# Patient Record
Sex: Female | Born: 1935 | Race: White | Hispanic: No | Marital: Married | Smoking: Never smoker
Health system: Southern US, Community
[De-identification: ages and names within clinical notes are randomized; demographics above are authoritative.]

## PROBLEM LIST (undated history)

## (undated) DIAGNOSIS — I10 Essential (primary) hypertension: Secondary | ICD-10-CM

## (undated) DIAGNOSIS — C679 Malignant neoplasm of bladder, unspecified: Secondary | ICD-10-CM

## (undated) DIAGNOSIS — E785 Hyperlipidemia, unspecified: Secondary | ICD-10-CM

## (undated) HISTORY — PX: EYE SURGERY: SHX253

## (undated) HISTORY — DX: Essential (primary) hypertension: I10

## (undated) HISTORY — PX: BREAST SURGERY: SHX581

## (undated) HISTORY — PX: OTHER SURGICAL HISTORY: SHX169

## (undated) HISTORY — DX: Malignant neoplasm of bladder, unspecified: C67.9

## (undated) HISTORY — PX: CYSTECTOMY: SUR359

## (undated) HISTORY — DX: Hyperlipidemia, unspecified: E78.5

## (undated) HISTORY — PX: ABDOMINAL HYSTERECTOMY: SHX81

---

## 1999-10-09 ENCOUNTER — Other Ambulatory Visit: Admission: RE | Admit: 1999-10-09 | Discharge: 1999-10-09 | Payer: Self-pay | Admitting: Obstetrics and Gynecology

## 2000-09-16 ENCOUNTER — Other Ambulatory Visit: Admission: RE | Admit: 2000-09-16 | Discharge: 2000-09-16 | Payer: Self-pay | Admitting: Obstetrics and Gynecology

## 2001-02-02 ENCOUNTER — Encounter: Payer: Self-pay | Admitting: Obstetrics and Gynecology

## 2001-02-09 ENCOUNTER — Encounter (INDEPENDENT_AMBULATORY_CARE_PROVIDER_SITE_OTHER): Payer: Self-pay | Admitting: Specialist

## 2001-02-09 ENCOUNTER — Inpatient Hospital Stay (HOSPITAL_COMMUNITY): Admission: RE | Admit: 2001-02-09 | Discharge: 2001-02-11 | Payer: Self-pay | Admitting: Obstetrics and Gynecology

## 2001-02-15 ENCOUNTER — Inpatient Hospital Stay (HOSPITAL_COMMUNITY): Admission: AD | Admit: 2001-02-15 | Discharge: 2001-02-15 | Payer: Self-pay | Admitting: Obstetrics and Gynecology

## 2001-08-07 ENCOUNTER — Encounter: Payer: Self-pay | Admitting: Urology

## 2001-08-07 ENCOUNTER — Observation Stay (HOSPITAL_COMMUNITY): Admission: RE | Admit: 2001-08-07 | Discharge: 2001-08-08 | Payer: Self-pay | Admitting: Urology

## 2001-08-07 ENCOUNTER — Encounter (INDEPENDENT_AMBULATORY_CARE_PROVIDER_SITE_OTHER): Payer: Self-pay | Admitting: *Deleted

## 2001-08-08 ENCOUNTER — Encounter: Payer: Self-pay | Admitting: Urology

## 2001-08-31 ENCOUNTER — Encounter (INDEPENDENT_AMBULATORY_CARE_PROVIDER_SITE_OTHER): Payer: Self-pay | Admitting: Specialist

## 2001-08-31 ENCOUNTER — Inpatient Hospital Stay (HOSPITAL_COMMUNITY): Admission: RE | Admit: 2001-08-31 | Discharge: 2001-09-08 | Payer: Self-pay | Admitting: Urology

## 2001-09-29 ENCOUNTER — Encounter: Admission: RE | Admit: 2001-09-29 | Discharge: 2001-09-29 | Payer: Self-pay | Admitting: Urology

## 2001-09-29 ENCOUNTER — Encounter: Payer: Self-pay | Admitting: Urology

## 2001-12-29 ENCOUNTER — Encounter: Admission: RE | Admit: 2001-12-29 | Discharge: 2001-12-29 | Payer: Self-pay | Admitting: Urology

## 2001-12-29 ENCOUNTER — Encounter: Payer: Self-pay | Admitting: Urology

## 2002-03-30 ENCOUNTER — Encounter: Admission: RE | Admit: 2002-03-30 | Discharge: 2002-03-30 | Payer: Self-pay | Admitting: Urology

## 2002-03-30 ENCOUNTER — Encounter: Payer: Self-pay | Admitting: Urology

## 2002-04-20 ENCOUNTER — Other Ambulatory Visit: Admission: RE | Admit: 2002-04-20 | Discharge: 2002-04-20 | Payer: Self-pay | Admitting: Obstetrics and Gynecology

## 2002-05-04 ENCOUNTER — Encounter: Admission: RE | Admit: 2002-05-04 | Discharge: 2002-05-04 | Payer: Self-pay | Admitting: Internal Medicine

## 2002-05-04 ENCOUNTER — Encounter: Payer: Self-pay | Admitting: Internal Medicine

## 2002-09-03 ENCOUNTER — Encounter: Payer: Self-pay | Admitting: Oncology

## 2002-09-03 ENCOUNTER — Encounter: Admission: RE | Admit: 2002-09-03 | Discharge: 2002-09-03 | Payer: Self-pay | Admitting: Oncology

## 2003-02-14 ENCOUNTER — Encounter: Payer: Self-pay | Admitting: Oncology

## 2003-02-14 ENCOUNTER — Encounter: Admission: RE | Admit: 2003-02-14 | Discharge: 2003-02-14 | Payer: Self-pay | Admitting: Oncology

## 2003-04-25 ENCOUNTER — Other Ambulatory Visit: Admission: RE | Admit: 2003-04-25 | Discharge: 2003-04-25 | Payer: Self-pay | Admitting: Obstetrics and Gynecology

## 2003-06-21 ENCOUNTER — Ambulatory Visit (HOSPITAL_COMMUNITY): Admission: RE | Admit: 2003-06-21 | Discharge: 2003-06-21 | Payer: Self-pay | Admitting: Internal Medicine

## 2003-06-21 ENCOUNTER — Encounter: Payer: Self-pay | Admitting: Internal Medicine

## 2003-12-23 ENCOUNTER — Ambulatory Visit (HOSPITAL_COMMUNITY): Admission: RE | Admit: 2003-12-23 | Discharge: 2003-12-23 | Payer: Self-pay | Admitting: Oncology

## 2004-08-17 ENCOUNTER — Other Ambulatory Visit: Admission: RE | Admit: 2004-08-17 | Discharge: 2004-08-17 | Payer: Self-pay | Admitting: Obstetrics and Gynecology

## 2004-11-21 ENCOUNTER — Ambulatory Visit: Payer: Self-pay | Admitting: Internal Medicine

## 2004-12-21 ENCOUNTER — Ambulatory Visit: Payer: Self-pay | Admitting: Internal Medicine

## 2004-12-28 ENCOUNTER — Ambulatory Visit: Payer: Self-pay | Admitting: Internal Medicine

## 2005-02-06 ENCOUNTER — Ambulatory Visit: Payer: Self-pay | Admitting: Oncology

## 2005-02-08 ENCOUNTER — Ambulatory Visit (HOSPITAL_COMMUNITY): Admission: RE | Admit: 2005-02-08 | Discharge: 2005-02-08 | Payer: Self-pay | Admitting: Oncology

## 2005-03-28 ENCOUNTER — Ambulatory Visit: Payer: Self-pay | Admitting: Internal Medicine

## 2005-04-05 ENCOUNTER — Ambulatory Visit: Payer: Self-pay | Admitting: Internal Medicine

## 2005-07-05 ENCOUNTER — Ambulatory Visit: Payer: Self-pay | Admitting: Internal Medicine

## 2005-07-12 ENCOUNTER — Ambulatory Visit: Payer: Self-pay | Admitting: Internal Medicine

## 2005-08-08 ENCOUNTER — Ambulatory Visit: Payer: Self-pay | Admitting: Oncology

## 2005-11-12 ENCOUNTER — Ambulatory Visit: Payer: Self-pay | Admitting: Internal Medicine

## 2005-11-19 ENCOUNTER — Ambulatory Visit: Payer: Self-pay | Admitting: Internal Medicine

## 2006-04-15 ENCOUNTER — Ambulatory Visit: Payer: Self-pay | Admitting: Internal Medicine

## 2006-04-21 ENCOUNTER — Ambulatory Visit: Payer: Self-pay | Admitting: Internal Medicine

## 2006-05-27 ENCOUNTER — Ambulatory Visit: Payer: Self-pay | Admitting: Internal Medicine

## 2006-06-24 ENCOUNTER — Ambulatory Visit: Payer: Self-pay | Admitting: Internal Medicine

## 2006-07-02 ENCOUNTER — Ambulatory Visit: Payer: Self-pay | Admitting: Internal Medicine

## 2006-08-21 ENCOUNTER — Ambulatory Visit: Payer: Self-pay | Admitting: Internal Medicine

## 2006-08-28 ENCOUNTER — Ambulatory Visit: Payer: Self-pay | Admitting: Internal Medicine

## 2006-12-25 ENCOUNTER — Ambulatory Visit: Payer: Self-pay | Admitting: Internal Medicine

## 2006-12-25 LAB — CONVERTED CEMR LAB
CO2: 26 meq/L (ref 19–32)
Calcium: 10.3 mg/dL (ref 8.4–10.5)
Chloride: 101 meq/L (ref 96–112)
Creatinine, Ser: 1.4 mg/dL — ABNORMAL HIGH (ref 0.4–1.2)
Glomerular Filtration Rate, Af Am: 48 mL/min/{1.73_m2}
Glucose, Bld: 191 mg/dL — ABNORMAL HIGH (ref 70–99)

## 2007-01-01 ENCOUNTER — Ambulatory Visit: Payer: Self-pay | Admitting: Internal Medicine

## 2007-02-02 ENCOUNTER — Ambulatory Visit: Payer: Self-pay | Admitting: Internal Medicine

## 2007-03-17 ENCOUNTER — Ambulatory Visit: Payer: Self-pay | Admitting: Internal Medicine

## 2007-03-24 ENCOUNTER — Ambulatory Visit: Payer: Self-pay | Admitting: Internal Medicine

## 2007-06-11 ENCOUNTER — Encounter: Payer: Self-pay | Admitting: Internal Medicine

## 2007-06-11 DIAGNOSIS — C679 Malignant neoplasm of bladder, unspecified: Secondary | ICD-10-CM | POA: Insufficient documentation

## 2007-06-11 DIAGNOSIS — E119 Type 2 diabetes mellitus without complications: Secondary | ICD-10-CM

## 2007-06-11 DIAGNOSIS — I10 Essential (primary) hypertension: Secondary | ICD-10-CM

## 2007-06-11 DIAGNOSIS — E785 Hyperlipidemia, unspecified: Secondary | ICD-10-CM

## 2007-07-21 ENCOUNTER — Ambulatory Visit: Payer: Self-pay | Admitting: Internal Medicine

## 2007-07-21 DIAGNOSIS — M810 Age-related osteoporosis without current pathological fracture: Secondary | ICD-10-CM | POA: Insufficient documentation

## 2007-07-26 LAB — CONVERTED CEMR LAB: Vit D, 1,25-Dihydroxy: 28 (ref 20–57)

## 2007-07-28 LAB — CONVERTED CEMR LAB
ALT: 17 units/L (ref 0–35)
BUN: 33 mg/dL — ABNORMAL HIGH (ref 6–23)
Basophils Absolute: 0 10*3/uL (ref 0.0–0.1)
Chloride: 111 meq/L (ref 96–112)
Cholesterol: 196 mg/dL (ref 0–200)
Creatinine, Ser: 1.1 mg/dL (ref 0.4–1.2)
Eosinophils Absolute: 0.1 10*3/uL (ref 0.0–0.6)
GFR calc non Af Amer: 52 mL/min
HDL: 106.3 mg/dL (ref >39.0)
Hgb A1c MFr Bld: 7 % — ABNORMAL HIGH (ref 4.6–6.0)
LDL Cholesterol: 74 mg/dL (ref 0–99)
MCHC: 34.6 g/dL (ref 30.0–36.0)
Monocytes Relative: 10 % (ref 3.0–11.0)
Platelets: 177 10*3/uL (ref 150–400)
Potassium: 4.3 meq/L (ref 3.5–5.1)
RBC: 3.56 M/uL — ABNORMAL LOW (ref 3.87–5.11)
RDW: 13.5 % (ref 11.5–14.6)
TSH: 1.45 microintl units/mL (ref 0.35–5.50)
Total CHOL/HDL Ratio: 1.8
Triglycerides: 79 mg/dL (ref 0–149)

## 2007-08-14 ENCOUNTER — Telehealth: Payer: Self-pay | Admitting: Internal Medicine

## 2007-08-24 ENCOUNTER — Ambulatory Visit: Payer: Self-pay | Admitting: Internal Medicine

## 2007-08-24 DIAGNOSIS — D649 Anemia, unspecified: Secondary | ICD-10-CM | POA: Insufficient documentation

## 2007-12-01 ENCOUNTER — Ambulatory Visit: Payer: Self-pay | Admitting: Internal Medicine

## 2007-12-02 ENCOUNTER — Encounter: Payer: Self-pay | Admitting: Internal Medicine

## 2007-12-02 LAB — CONVERTED CEMR LAB: Vit D, 1,25-Dihydroxy: 43 (ref 30–89)

## 2007-12-03 LAB — CONVERTED CEMR LAB
Basophils Absolute: 0 10*3/uL (ref 0.0–0.1)
Basophils Relative: 0.1 % (ref 0.0–1.0)
Folate: >20 ng/mL
Lymphocytes Relative: 33.1 % (ref 12.0–46.0)
MCHC: 34.5 g/dL (ref 30.0–36.0)
Monocytes Absolute: 0.5 10*3/uL (ref 0.2–0.7)
Monocytes Relative: 11.3 % — ABNORMAL HIGH (ref 3.0–11.0)
Neutro Abs: 2.2 10*3/uL (ref 1.4–7.7)
Platelets: 196 10*3/uL (ref 150–400)
Vitamin B-12: 263 pg/mL (ref 211–911)

## 2007-12-07 ENCOUNTER — Ambulatory Visit: Payer: Self-pay | Admitting: Internal Medicine

## 2007-12-07 DIAGNOSIS — N1330 Unspecified hydronephrosis: Secondary | ICD-10-CM

## 2007-12-07 DIAGNOSIS — Z8551 Personal history of malignant neoplasm of bladder: Secondary | ICD-10-CM | POA: Insufficient documentation

## 2008-01-11 ENCOUNTER — Telehealth: Payer: Self-pay | Admitting: Internal Medicine

## 2008-02-29 ENCOUNTER — Ambulatory Visit: Payer: Self-pay | Admitting: Internal Medicine

## 2008-03-02 LAB — CONVERTED CEMR LAB
Chloride: 109 meq/L (ref 96–112)
Creatinine, Ser: 1.2 mg/dL (ref 0.4–1.2)
Eosinophils Relative: 1.4 % (ref 0.0–5.0)
Glucose, Bld: 186 mg/dL — ABNORMAL HIGH (ref 70–99)
Hemoglobin: 12.1 g/dL (ref 12.0–15.0)
Lymphocytes Relative: 34.2 % (ref 12.0–46.0)
MCHC: 33.1 g/dL (ref 30.0–36.0)
MCV: 91.1 fL (ref 78.0–100.0)
Neutrophils Relative %: 51.9 % (ref 43.0–77.0)
RBC: 4.03 M/uL (ref 3.87–5.11)
RDW: 13.2 % (ref 11.5–14.6)
Sodium: 141 meq/L (ref 135–145)

## 2008-03-07 ENCOUNTER — Ambulatory Visit: Payer: Self-pay | Admitting: Internal Medicine

## 2008-03-07 DIAGNOSIS — N259 Disorder resulting from impaired renal tubular function, unspecified: Secondary | ICD-10-CM

## 2008-03-21 ENCOUNTER — Telehealth: Payer: Self-pay | Admitting: *Deleted

## 2008-04-12 ENCOUNTER — Telehealth: Payer: Self-pay | Admitting: *Deleted

## 2008-04-13 ENCOUNTER — Telehealth: Payer: Self-pay | Admitting: Internal Medicine

## 2008-04-14 ENCOUNTER — Ambulatory Visit: Payer: Self-pay | Admitting: Internal Medicine

## 2008-04-14 LAB — CONVERTED CEMR LAB: Blood Glucose, Fingerstick: 375

## 2008-04-22 ENCOUNTER — Telehealth: Payer: Self-pay | Admitting: Internal Medicine

## 2008-05-27 ENCOUNTER — Ambulatory Visit: Payer: Self-pay | Admitting: Internal Medicine

## 2008-05-30 ENCOUNTER — Ambulatory Visit: Payer: Self-pay | Admitting: Internal Medicine

## 2008-05-30 LAB — CONVERTED CEMR LAB
Blood Glucose, Fingerstick: 160
Hgb A1c MFr Bld: 7.7 % — ABNORMAL HIGH (ref 4.6–6.0)

## 2008-06-01 ENCOUNTER — Ambulatory Visit: Payer: Self-pay | Admitting: Internal Medicine

## 2008-06-20 ENCOUNTER — Telehealth: Payer: Self-pay | Admitting: *Deleted

## 2008-07-01 ENCOUNTER — Encounter: Payer: Self-pay | Admitting: Internal Medicine

## 2008-08-19 ENCOUNTER — Ambulatory Visit: Payer: Self-pay | Admitting: Internal Medicine

## 2008-08-19 LAB — CONVERTED CEMR LAB
ALT: 20 units/L (ref 0–35)
AST: 26 units/L (ref 0–37)
Alkaline Phosphatase: 30 units/L — ABNORMAL LOW (ref 39–117)
BUN: 37 mg/dL — ABNORMAL HIGH (ref 6–23)
CO2: 25 meq/L (ref 19–32)
Chloride: 110 meq/L (ref 96–112)
Cholesterol: 181 mg/dL (ref 0–200)
Creatinine, Ser: 1.2 mg/dL (ref 0.4–1.2)
Hgb A1c MFr Bld: 7.2 % — ABNORMAL HIGH (ref 4.6–6.0)
Total Bilirubin: 0.9 mg/dL (ref 0.3–1.2)
Total CHOL/HDL Ratio: 1.9
Triglycerides: 61 mg/dL (ref 0–149)

## 2008-08-26 ENCOUNTER — Ambulatory Visit: Payer: Self-pay | Admitting: Internal Medicine

## 2008-09-06 ENCOUNTER — Telehealth: Payer: Self-pay | Admitting: *Deleted

## 2008-11-01 ENCOUNTER — Telehealth: Payer: Self-pay | Admitting: Internal Medicine

## 2008-11-29 ENCOUNTER — Ambulatory Visit: Payer: Self-pay | Admitting: Internal Medicine

## 2008-12-06 ENCOUNTER — Ambulatory Visit: Payer: Self-pay | Admitting: Internal Medicine

## 2008-12-06 DIAGNOSIS — M25569 Pain in unspecified knee: Secondary | ICD-10-CM | POA: Insufficient documentation

## 2009-01-03 ENCOUNTER — Telehealth: Payer: Self-pay | Admitting: *Deleted

## 2009-01-09 ENCOUNTER — Ambulatory Visit: Payer: Self-pay | Admitting: Internal Medicine

## 2009-01-09 DIAGNOSIS — H04209 Unspecified epiphora, unspecified lacrimal gland: Secondary | ICD-10-CM | POA: Insufficient documentation

## 2009-03-02 ENCOUNTER — Ambulatory Visit: Payer: Self-pay | Admitting: Internal Medicine

## 2009-03-02 LAB — CONVERTED CEMR LAB
BUN: 38 mg/dL — ABNORMAL HIGH (ref 6–23)
CO2: 28 meq/L (ref 19–32)
Chloride: 108 meq/L (ref 96–112)
Creatinine,U: 89.3 mg/dL
GFR calc non Af Amer: 46.65 mL/min (ref >60)
Glucose, Bld: 167 mg/dL — ABNORMAL HIGH (ref 70–99)
Microalb Creat Ratio: 88.5 mg/g — ABNORMAL HIGH (ref 0.0–30.0)
Potassium: 4.8 meq/L (ref 3.5–5.1)

## 2009-03-09 ENCOUNTER — Ambulatory Visit: Payer: Self-pay | Admitting: Internal Medicine

## 2009-03-09 LAB — CONVERTED CEMR LAB: Blood Glucose, Fingerstick: 188

## 2009-04-07 ENCOUNTER — Telehealth: Payer: Self-pay | Admitting: Internal Medicine

## 2009-04-18 ENCOUNTER — Telehealth: Payer: Self-pay | Admitting: *Deleted

## 2009-04-27 ENCOUNTER — Telehealth: Payer: Self-pay | Admitting: *Deleted

## 2009-05-16 ENCOUNTER — Ambulatory Visit: Payer: Self-pay | Admitting: Internal Medicine

## 2009-05-18 LAB — CONVERTED CEMR LAB: Hgb A1c MFr Bld: 7 % — ABNORMAL HIGH (ref 4.6–6.5)

## 2009-06-16 ENCOUNTER — Ambulatory Visit: Payer: Self-pay | Admitting: Internal Medicine

## 2009-06-16 LAB — CONVERTED CEMR LAB: Blood Glucose, Fingerstick: 136

## 2009-07-03 ENCOUNTER — Encounter: Payer: Self-pay | Admitting: Internal Medicine

## 2009-07-10 ENCOUNTER — Encounter: Payer: Self-pay | Admitting: Internal Medicine

## 2009-07-10 ENCOUNTER — Telehealth: Payer: Self-pay | Admitting: Internal Medicine

## 2009-07-18 ENCOUNTER — Encounter: Payer: Self-pay | Admitting: Internal Medicine

## 2009-09-14 ENCOUNTER — Telehealth: Payer: Self-pay | Admitting: *Deleted

## 2009-09-19 ENCOUNTER — Ambulatory Visit: Payer: Self-pay | Admitting: Internal Medicine

## 2009-09-19 LAB — CONVERTED CEMR LAB
Albumin: 4 g/dL (ref 3.5–5.2)
BUN: 37 mg/dL — ABNORMAL HIGH (ref 6–23)
Cholesterol: 207 mg/dL — ABNORMAL HIGH (ref 0–200)
Creatinine, Ser: 1.3 mg/dL — ABNORMAL HIGH (ref 0.4–1.2)
GFR calc non Af Amer: 42.47 mL/min (ref >60)
Glucose, Bld: 164 mg/dL — ABNORMAL HIGH (ref 70–99)
HDL: 93.4 mg/dL (ref >39.00)
Hgb A1c MFr Bld: 6.8 % — ABNORMAL HIGH (ref 4.6–6.5)
Total Protein: 6.9 g/dL (ref 6.0–8.3)
Triglycerides: 95 mg/dL (ref 0.0–149.0)
VLDL: 19 mg/dL (ref 0.0–40.0)

## 2009-09-26 ENCOUNTER — Ambulatory Visit: Payer: Self-pay | Admitting: Internal Medicine

## 2009-10-12 ENCOUNTER — Encounter: Payer: Self-pay | Admitting: Internal Medicine

## 2010-01-02 ENCOUNTER — Encounter: Payer: Self-pay | Admitting: Internal Medicine

## 2010-01-19 ENCOUNTER — Ambulatory Visit: Payer: Self-pay | Admitting: Internal Medicine

## 2010-01-19 LAB — CONVERTED CEMR LAB
BUN: 43 mg/dL — ABNORMAL HIGH (ref 6–23)
Creatinine, Ser: 1.2 mg/dL (ref 0.4–1.2)
GFR calc non Af Amer: 46.54 mL/min (ref >60)
Glucose, Bld: 148 mg/dL — ABNORMAL HIGH (ref 70–99)
Hgb A1c MFr Bld: 7 % — ABNORMAL HIGH (ref 4.6–6.5)

## 2010-01-23 ENCOUNTER — Encounter: Payer: Self-pay | Admitting: Internal Medicine

## 2010-01-26 ENCOUNTER — Ambulatory Visit: Payer: Self-pay | Admitting: Internal Medicine

## 2010-01-26 DIAGNOSIS — M949 Disorder of cartilage, unspecified: Secondary | ICD-10-CM

## 2010-01-26 DIAGNOSIS — M899 Disorder of bone, unspecified: Secondary | ICD-10-CM | POA: Insufficient documentation

## 2010-02-27 ENCOUNTER — Telehealth: Payer: Self-pay | Admitting: *Deleted

## 2010-04-20 ENCOUNTER — Telehealth: Payer: Self-pay | Admitting: *Deleted

## 2010-05-01 ENCOUNTER — Ambulatory Visit: Payer: Self-pay | Admitting: Internal Medicine

## 2010-05-01 LAB — CONVERTED CEMR LAB
Microalb Creat Ratio: 7.8 mg/g (ref 0.0–30.0)
Microalb, Ur: 8.3 mg/dL — ABNORMAL HIGH (ref 0.0–1.9)

## 2010-05-08 ENCOUNTER — Ambulatory Visit: Payer: Self-pay | Admitting: Internal Medicine

## 2010-05-10 LAB — CONVERTED CEMR LAB
Basophils Absolute: 0 10*3/uL (ref 0.0–0.1)
Basophils Relative: 0.4 % (ref 0.0–3.0)
CO2: 28 meq/L (ref 19–32)
Calcium: 10.4 mg/dL (ref 8.4–10.5)
Eosinophils Relative: 0.9 % (ref 0.0–5.0)
GFR calc non Af Amer: 33.37 mL/min (ref >60)
Glucose, Bld: 166 mg/dL — ABNORMAL HIGH (ref 70–99)
HCT: 35.6 % — ABNORMAL LOW (ref 36.0–46.0)
Hemoglobin: 12.2 g/dL (ref 12.0–15.0)
Lymphocytes Relative: 24.2 % (ref 12.0–46.0)
Lymphs Abs: 1.5 10*3/uL (ref 0.7–4.0)
Monocytes Relative: 9.8 % (ref 3.0–12.0)
Neutro Abs: 3.9 10*3/uL (ref 1.4–7.7)
Potassium: 5 meq/L (ref 3.5–5.1)
RBC: 3.83 M/uL — ABNORMAL LOW (ref 3.87–5.11)
RDW: 14.3 % (ref 11.5–14.6)
Sodium: 140 meq/L (ref 135–145)

## 2010-05-15 ENCOUNTER — Encounter: Admission: RE | Admit: 2010-05-15 | Discharge: 2010-05-15 | Payer: Self-pay | Admitting: Internal Medicine

## 2010-06-08 ENCOUNTER — Encounter: Payer: Self-pay | Admitting: Internal Medicine

## 2010-07-11 ENCOUNTER — Encounter: Payer: Self-pay | Admitting: Internal Medicine

## 2010-08-14 ENCOUNTER — Encounter: Payer: Self-pay | Admitting: Internal Medicine

## 2010-08-16 LAB — HM DIABETES EYE EXAM: HM Diabetic Eye Exam: NORMAL

## 2010-08-30 ENCOUNTER — Ambulatory Visit: Payer: Self-pay | Admitting: Internal Medicine

## 2010-08-30 LAB — CONVERTED CEMR LAB
AST: 24 units/L (ref 0–37)
Albumin: 3.8 g/dL (ref 3.5–5.2)
HDL: 98 mg/dL (ref >39.00)
Hgb A1c MFr Bld: 7.3 % — ABNORMAL HIGH (ref 4.6–6.5)
LDL Cholesterol: 74 mg/dL (ref 0–99)
Total Bilirubin: 0.4 mg/dL (ref 0.3–1.2)
Total CHOL/HDL Ratio: 2
Triglycerides: 67 mg/dL (ref 0.0–149.0)
VLDL: 13.4 mg/dL (ref 0.0–40.0)

## 2010-09-24 ENCOUNTER — Ambulatory Visit: Payer: Self-pay | Admitting: Internal Medicine

## 2010-09-24 ENCOUNTER — Encounter: Payer: Self-pay | Admitting: *Deleted

## 2010-09-24 LAB — CONVERTED CEMR LAB
BUN: 46 mg/dL
Creatinine, Ser: 1.32 mg/dL
Potassium: 4.7 meq/L

## 2010-09-26 ENCOUNTER — Encounter: Payer: Self-pay | Admitting: Internal Medicine

## 2010-09-26 ENCOUNTER — Telehealth: Payer: Self-pay | Admitting: Internal Medicine

## 2010-10-15 ENCOUNTER — Encounter: Payer: Self-pay | Admitting: *Deleted

## 2010-12-28 ENCOUNTER — Other Ambulatory Visit: Payer: Self-pay | Admitting: Internal Medicine

## 2010-12-28 ENCOUNTER — Ambulatory Visit
Admission: RE | Admit: 2010-12-28 | Discharge: 2010-12-28 | Payer: Self-pay | Source: Home / Self Care | Attending: Internal Medicine | Admitting: Internal Medicine

## 2010-12-28 LAB — HEMOGLOBIN A1C: Hgb A1c MFr Bld: 7 % — ABNORMAL HIGH (ref 4.6–6.5)

## 2011-01-04 ENCOUNTER — Ambulatory Visit
Admission: RE | Admit: 2011-01-04 | Discharge: 2011-01-04 | Payer: Self-pay | Source: Home / Self Care | Attending: Internal Medicine | Admitting: Internal Medicine

## 2011-01-04 DIAGNOSIS — M79609 Pain in unspecified limb: Secondary | ICD-10-CM | POA: Insufficient documentation

## 2011-01-04 LAB — HM DIABETES FOOT EXAM

## 2011-01-06 ENCOUNTER — Encounter: Payer: Self-pay | Admitting: Sports Medicine

## 2011-01-15 NOTE — Progress Notes (Signed)
Summary: refill on meds  Phone Note From Pharmacy   Caller: [facility] Reason for Call: Needs renewal Details for Reason: Diovan 160mg  and Actos Initial call taken by: Romualdo Bolk, CMA (AAMA),  February 27, 2010 11:12 AM  Follow-up for Phone Call        refill on meds. Follow-up by: Romualdo Bolk, CMA (AAMA),  February 27, 2010 11:13 AM    Prescriptions: DIOVAN 160 MG TABS (VALSARTAN) Take 1 tablet by mouth once a day  #90 x 3   Entered by:   Romualdo Bolk, CMA (AAMA)   Authorized by:   Madelin Headings MD   Signed by:   Romualdo Bolk, CMA (AAMA) on 02/27/2010   Method used:   Electronically to        [facility]* (mail-order)             ,          Ph: [phone]       Fax: [phone]   RxID:   [id number] ACTOS 30 MG TABS (PIOGLITAZONE HCL) Take 1 tablet by mouth once a day  #90 x 3   Entered by:   Romualdo Bolk, CMA (AAMA)   Authorized by:   Madelin Headings MD   Signed by:   Romualdo Bolk, CMA (AAMA) on 02/27/2010   Method used:   Electronically to        [facility]* (mail-order)             ,          Ph: [phone]       Fax: [phone]   RxID:   [id number]

## 2011-01-15 NOTE — Assessment & Plan Note (Signed)
Summary: 4 MONTH FUP//CCM   Vital Signs:  Patient profile:   77 year old female Menstrual status:  postmenopausal Height:      60.5 inches Weight:      150 pounds BMI:     28.92 Pulse rate:   88 / minute BP sitting:   120 / 79  (right arm) Cuff size:   regular  Vitals Entered By: Romualdo Bolk, CMA (AAMA) (January 26, 2010 10:48 AM) CC: Follow-up visit on labs, Hypertension Management   History of Present Illness: Cassandra Evans comesin today for   for  follow up of multiple medical problems   since last visit had Dexa scan   No  major change in health  Saw  GYNE To see eye doc and changed drops.  BG has been good .  somehow after couple glasses of wine. No numbness weakness skin changes or infections.  BP has been good.  no se of meds  LIPID: no change and no se of meds . No cv or pulm signs   Hypertension History:      She denies headache, chest pain, palpitations, dyspnea with exertion, orthopnea, PND, peripheral edema, visual symptoms, neurologic problems, syncope, and side effects from treatment.  She notes no problems with any antihypertensive medication side effects.        Positive major cardiovascular risk factors include female age 98 years old or older, diabetes, hyperlipidemia, and hypertension.  Negative major cardiovascular risk factors include non-tobacco-user status.       Preventive Screening-Counseling & Management  Alcohol-Tobacco     Alcohol drinks/day: <1     Alcohol type: wine     Smoking Status: never  Caffeine-Diet-Exercise     Caffeine use/day: 3     Does Patient Exercise: yes     Type of exercise: aerobics and walking     Exercise (avg: min/session): 30-60     Times/week: 7  Current Medications (verified): 1)  Actos 30 Mg Tabs (Pioglitazone Hcl) .... Take 1 Tablet By Mouth Once A Day 2)  Bd U/f Iii Short Pen Needle 31g X 8 Mm Misc (Insulin Pen Needle) .... Use As Directed 3)  Calcium 500 Mg Tabs (Calcium) .... Take 4)  Diovan  160 Mg Tabs (Valsartan) .... Take 1 Tablet By Mouth Once A Day 5)  Fosamax 70 Mg Tabs (Alendronate Sodium) .... Take 1 Tablet By Mouth Once A Week 6)  Freestyle Lancets  Misc (Lancets) .... Use 1 Needle Four Times A Day 7)  Glipizide 5 Mg Tb24 (Glipizide) .Marland Kitchen.. 1 By Mouth Once Daily 8)  Lantus Solostar 100 Unit/ml  Soln (Insulin Glargine) .... 6-10 U Per Day As Directed 9)  Pataday 0.2 % Soln (Olopatadine Hcl) 10)  Simvastatin 40 Mg Tabs (Simvastatin) .... Take 1 Tablet By Mouth At Bedtime 11)  Multiple Vitamins   Tabs (Multiple Vitamin) 12)  Hollister 8484-Urostomy Pouch .... Use 1 A Week 13)  Bard Pd [id number] Overnight Drainage .... Use As Directed 14)  Freestyle Test   Strp (Glucose Blood) .... Use 1 Strip Four Times A Day As Directed 15)  Baby Aspirin 81 Mg Chew (Aspirin) .... Take 1/2 Tab Daily 16)  Januvia 100 Mg Tabs (Sitagliptin Phosphate) .... 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  Past Medical History: Reviewed history from 01/09/2009 and no changes required. Diabetes mellitus, type II Hyperlipidemia Hypertension Osteoporosis Bladder cancer more than 5 years out       [facility] Dr. Hazle Quant  eye  Past Surgical History: Reviewed history from 05/27/2008 and no changes required. Hysterectomy Cystectomy Incontinent Urinary Diversion    Family History: Reviewed history from 12/06/2008 and no changes required. mom died of leukemia   Social History: Reviewed history from 03/09/2009 and no changes  required. 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   Eyes:  glasses Neck:  No deformities, masses, or tenderness noted. Lungs:  Normal respiratory effort, chest expands symmetrically. Lungs are clear to auscultation, no crackles or wheezes.no dullness.   Heart:  Normal rate and regular rhythm. S1 and S2 normal without gallop, murmur, click, rub or other extra sounds. Abdomen:  soft, non-tender, no hepatomegaly, and no splenomegaly.   Extremities:  no clubbing cyanosis or edema  Neurologic:  alert & oriented X3, strength normal in all extremities, and gait normal.   Skin:  turgor normal, color normal, no ecchymoses, no petechiae, and no purpura.   Cervical Nodes:  No lymphadenopathy noted Psych:  Oriented X3, good eye contact, not anxious appearing, and not depressed appearing.    Diabetes Management Exam:    Foot Exam (with socks and/or shoes not present):       Inspection:          Left foot: normal          Right foot: normal   Impression & Recommendations:  Problem # 1:  DIABETES MELLITUS, TYPE II (ICD-250.00) counseled  Her updated medication list for this problem includes:    Actos 30 Mg Tabs (Pioglitazone hcl) .Marland Kitchen... Take 1 tablet by mouth once a day    Diovan 160 Mg Tabs (Valsartan) .Marland Kitchen... Take 1 tablet by mouth once a day    Glipizide 5 Mg Tb24 (Glipizide) .Marland Kitchen... 1 by mouth once daily    Lantus Solostar 100 Unit/ml Soln (Insulin glargine) .Marland Kitchen... 6-10 u per day as directed    Baby Aspirin 81 Mg Chew (Aspirin) .Marland Kitchen... Take 1/2 tab daily    Januvia 100 Mg Tabs (Sitagliptin phosphate) .Marland Kitchen... Take 1/2 by mouth q d  Labs Reviewed: Creat: 1.2 (01/19/2010)     Last Eye Exam: normal (03/16/2009) Reviewed HgBA1c results: 7.0 (01/19/2010)  6.8 (09/19/2009)  Problem #  2:  HYPERTENSION (ICD-401.9) controlled  Her updated medication list for this problem includes:    Diovan 160 Mg Tabs (Valsartan) .Marland Kitchen... Take 1 tablet by mouth once a day  Problem # 3:  HYPERLIPIDEMIA (ICD-272.4)  Her updated medication list for this problem includes:    Simvastatin 40 Mg Tabs (Simvastatin) .Marland Kitchen... Take 1 tablet by mouth at bedtime  Labs Reviewed: SGOT: 26 (09/19/2009)   SGPT: 21 (09/19/2009)  Prior 10 Yr Risk Heart Disease: 9 % (09/26/2009)   HDL:93.40 (09/19/2009), 95.2 (08/19/2008)  LDL:74 (08/19/2008), 74 (07/21/2007)  Chol:207 (09/19/2009), 181 (08/19/2008)  Trig:95.0 (09/19/2009), 61 (08/19/2008)  Problem # 4:  RENAL INSUFFICIENCY (ICD-588.9) Assessment: Unchanged cants explain the bump in BUn  but creatinine is quite stable.  Problem # 5:  OSTEOPENIA (ICD-733.90) see dexa  in osteopeni range    burt has been on fosamax for about 10 years and no hx of fx or fam hx of fx . agree can stop med for now.  disc no consensus on length of med rx recs. Her updated medication list for this problem includes:    Calcium 500 Mg Tabs (Calcium) .Marland Kitchen... Take    Fosamax 70 Mg Tabs (Alendronate sodium) .Marland Kitchen... Take 1 tablet by mouth once a week    Vitamin D 400 Unit Caps (Cholecalciferol)  Complete Medication List: 1)  Actos 30 Mg Tabs (Pioglitazone hcl) .... Take 1 tablet by mouth once a day 2)  Bd U/f Iii Short Pen Needle 31g X 8 Mm Misc (Insulin pen needle) .... Use as directed 3)  Calcium 500 Mg Tabs (Calcium) .... Take 4)  Diovan 160 Mg Tabs (Valsartan) .... Take 1 tablet by mouth once a day 5)  Fosamax 70 Mg Tabs (Alendronate sodium) .... Take 1 tablet by mouth once a week 6)  Freestyle Lancets Misc (Lancets) .... Use 1 needle four times a day 7)  Glipizide 5 Mg Tb24 (Glipizide) .Marland Kitchen.. 1 by mouth once daily 8)  Lantus Solostar 100 Unit/ml Soln (Insulin glargine) .... 6-10 u per day as directed 9)  Pataday 0.2 % Soln (Olopatadine hcl) 10)  Simvastatin 40 Mg Tabs (Simvastatin)  .... Take 1 tablet by mouth at bedtime 11)  Multiple Vitamins Tabs (Multiple vitamin) 12)  Hollister 8484-urostomy Pouch  .... Use 1 a week 13)  Bard Pd [id number] Overnight Drainage  .... Use as directed 14)  Freestyle Test Strp (Glucose blood) .... Use 1 strip four times a day as directed 15)  Baby Aspirin 81 Mg Chew (Aspirin) .... Take 1/2 tab daily 16)  Januvia 100 Mg Tabs (Sitagliptin phosphate) .... Take 1/2 by mouth q d 17)  Vitamin D 400 Unit Caps (Cholecalciferol)  Hypertension Assessment/Plan:      The patient's hypertensive risk group is category C: Target organ damage and/or diabetes.  Her calculated 10 year risk of coronary heart disease is 9 %.  Today's blood pressure is 120/79.  Her blood pressure goal is < 130/80.  Patient Instructions: 1)  ok to stop the fosomax   and   can repeat  dexa  scan in another 2 years.   2)  HgBA1c prior to visit  ICD-9:    3)  Urine Microalbumin prior to visit ICD-9 :  4)  Bun and creatinine    5)  Please schedule a follow-up appointment in 3 months .  6)  Continue to have fun .

## 2011-01-15 NOTE — Letter (Signed)
Summary: [facility]   Imported By: Maryln Gottron 10/16/2010 15:36:42  _____________________________________________________________________  External Attachment:    Type:   Image     Comment:   External Document

## 2011-01-15 NOTE — Progress Notes (Signed)
Summary: FYI  Phone Note Call from Patient Call back at [facility] Phone [phone]   Caller: Patient Summary of Call: FYI- Pt states that she saw Dr. Allena Katz is very pleased with the results. She is to continue what she is doing. Initial call taken by: Romualdo Bolk, CMA Duncan Dull),  September 26, 2010 4:34 PM

## 2011-01-15 NOTE — Assessment & Plan Note (Signed)
Summary: 3 month rov/njr   Vital Signs:  Patient profile:   77 year old female Menstrual status:  postmenopausal Height:      60.5 inches Weight:      149 pounds Temp:     98.2 degrees F oral Pulse rate:   97 / minute BP sitting:   130 / 60  (right arm)  Vitals Entered By: Kathrynn Speed CMA (May 08, 2010 10:43 AM) CC: 3 mth FU / labs, Hypertension Management   History of Present Illness: Cassandra Evans comesin comes in today  for follow up of her multiple medical problems . She feels well and no change in  meds . No interim sig illness. since her February visit. DM: seems controlled   Low after 2 glasses of wine.   45 at night    x 1 . no other times .  HT bp has been normal  nose of meds  LIPIDs: nose of meds No use of  nsaids on a areg basis but does take asas.  Hypertension History:      She denies headache, chest pain, palpitations, dyspnea with exertion, peripheral edema, visual symptoms, neurologic problems, syncope, and side effects from treatment.  She notes no problems with any antihypertensive medication side effects.        Positive major cardiovascular risk factors include female age 10 years old or older, diabetes, hyperlipidemia, and hypertension.  Negative major cardiovascular risk factors include non-tobacco-user status.     Preventive Screening-Counseling & Management  Alcohol-Tobacco     Alcohol drinks/day: <1     Alcohol type: wine     Smoking Status: never  Caffeine-Diet-Exercise     Caffeine use/day: 3     Does Patient Exercise: yes     Type of exercise: aerobics and walking     Exercise (avg: min/session): 30-60     Times/week: 7  Current Medications (verified): 1)  Actos 30 Mg Tabs (Pioglitazone Hcl) .... Take 1 Tablet By Mouth Once A Day 2)  Bd U/f Iii Short Pen Needle 31g X 8 Mm Misc (Insulin Pen Needle) .... Use As Directed 3)  Calcium 500 Mg Tabs (Calcium) .... Take 4)  Diovan 160 Mg Tabs (Valsartan) .... Take 1 Tablet By Mouth Once A  Day 5)  Fosamax 70 Mg Tabs (Alendronate Sodium) .... Take 1 Tablet By Mouth Once A Week 6)  Freestyle Lancets  Misc (Lancets) .... Use 1 Needle Four Times A Day 7)  Glipizide 5 Mg Tb24 (Glipizide) .Marland Kitchen.. 1 By Mouth Once Daily 8)  Lantus Solostar 100 Unit/ml  Soln (Insulin Glargine) .... 6-10 U Per Day As Directed 9)  Simvastatin 40 Mg Tabs (Simvastatin) .... Take 1 Tablet By Mouth At Bedtime 10)  Multiple Vitamins   Tabs (Multiple Vitamin) 11)  Hollister 8484-Urostomy Pouch .... Use 1 A Week 12)  Bard Pd [id number] Overnight Drainage .... Use As Directed 13)  Freestyle Test   Strp (Glucose Blood) .... Use 1 Strip Four Times A Day As Directed 14)  Baby Aspirin 81 Mg Chew (Aspirin) .... Take 1/2 Tab Daily 15)  Januvia 100 Mg Tabs (Sitagliptin Phosphate) .... Take 1/2 By Mouth Q D 16)  Vitamin D 400 Unit Caps (Cholecalciferol) 17)  Patanol 0.1 % Soln (Olopatadine Hcl) .Marland Kitchen.. 1 Drop Each Eye 2 X A Day  Allergies (verified): No Known Drug Allergies  Past History:  Care Management: Gynecology: Dr. 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   Review of Systems       nef cv pulmonary and no GI GU signs no bleeding or swollen glands   fever or weight change   Physical Exam  General:  Well-developed,well-nourished,in no acute distress; alert,appropriate and cooperative throughout examination Head:  normocephalic and atraumatic.   Eyes:  glasses  Neck:  No deformities, masses, or tenderness noted. Lungs:  Normal respiratory effort, chest expands symmetrically. Lungs are clear to auscultation, no crackles or wheezes.no dullness.   Heart:  Normal rate and regular rhythm. S1 and S2 normal without gallop, murmur, click, rub or other extra sounds. Abdomen:  soft, non-tender, no hepatomegaly,  and no splenomegaly.   Pulses:  pulses intact without delay   Extremities:  no clubbing cyanosis or edema  Neurologic:  alert & oriented X3, sensation intact to light touch, and gait normal.   Skin:  turgor normal, color normal, no ecchymoses, and no petechiae.   Cervical Nodes:  No lymphadenopathy noted Psych:  Oriented X3, normally interactive, good eye contact, not anxious appearing, and not depressed appearing.    Diabetes Management Exam:    Foot Exam (with socks and/or shoes not present):       Sensory-Monofilament:          Left foot: normal          Right foot: normal       Inspection:          Left foot: normal          Right foot: normal   Impression & Recommendations:  Problem # 1:  DIABETES MELLITUS, TYPE II (ICD-250.00) Assessment Improved at goal  disc avoiding hypoglycemia  Her updated medication list for this problem includes:    Actos 30 Mg Tabs (Pioglitazone hcl) .Marland Kitchen... Take 1 tablet by mouth once a day    Diovan 160 Mg Tabs (Valsartan) .Marland Kitchen... Take 1 tablet by mouth once a day    Glipizide 5 Mg Tb24 (Glipizide) .Marland Kitchen... 1 by mouth once daily    Lantus Solostar 100 Unit/ml Soln (Insulin glargine) .Marland Kitchen... 6-10 u per day as directed    Baby Aspirin 81 Mg Chew (Aspirin) .Marland Kitchen... Take 1/2 tab daily    Januvia 100 Mg Tabs (Sitagliptin phosphate) .Marland Kitchen... Take 1/2 by mouth q d  Orders: TLB-TSH (Thyroid Stimulating Hormone) [phone])  Labs Reviewed: Creat: 1.6 (05/01/2010)     Last Eye Exam: normal (03/16/2009) Reviewed HgBA1c results: 6.8 (05/01/2010)  7.0 (01/19/2010)  Problem # 2:  RENAL INSUFFICIENCY (ICD-588.9) Assessment: Deteriorated  ? explanation    of cr 1.6       will recheck  and if persists    then   renal consult     Orders: TLB-BMP (Basic Metabolic Panel-BMET) (80048-METABOL) TLB-CBC Platelet - w/Differential (85025-CBCD) TLB-TSH (Thyroid Stimulating Hormone) (84443-TSH) Venipuncture (54098)  Problem # 3:  HYPERTENSION (ICD-401.9) Assessment:  Unchanged  Her updated medication list for this problem includes:    Diovan 160 Mg Tabs (Valsartan) .Marland Kitchen... Take 1 tablet by mouth once a day  Orders: TLB-TSH (Thyroid Stimulating Hormone) (84443-TSH)  BP today: 130/60 Prior BP: 120/79 (01/26/2010)  Prior 10 Yr Risk Heart Disease: 9 % (09/26/2009)  Labs Reviewed: K+: 4.6 (01/19/2010) Creat: : 1.6 (05/01/2010)   Chol: 207 (09/19/2009)   HDL: 93.40 (09/19/2009)  LDL: 74 (08/19/2008)   TG: 95.0 (09/19/2009)  Problem # 4:  NEOPLASM, MALIGNANT, BLADDER, HX OF (ICD-V10.51) stable on a as needed  follow up   Complete Medication List: 1)  Actos 30 Mg Tabs (Pioglitazone hcl) .... Take 1 tablet by mouth once a day 2)  Bd U/f Iii Short Pen Needle 31g X 8 Mm Misc (Insulin pen needle) .... Use as directed 3)  Calcium 500 Mg Tabs (Calcium) .... Take 4)  Diovan 160 Mg Tabs (Valsartan) .... Take 1 tablet by mouth once a day 5)  Fosamax 70 Mg Tabs (Alendronate sodium) .... Take 1 tablet by mouth once a week 6)  Freestyle Lancets Misc (Lancets) .... Use 1 needle four times a day 7)  Glipizide 5 Mg Tb24 (Glipizide) .Marland Kitchen.. 1 by mouth once daily 8)  Lantus Solostar 100 Unit/ml Soln (Insulin glargine) .... 6-10 u per day as directed 9)  Simvastatin 40 Mg Tabs (Simvastatin) .... Take 1 tablet by mouth at bedtime 10)  Multiple Vitamins Tabs (Multiple vitamin) 11)  Hollister 8484-urostomy Pouch  .... Use 1 a week 12)  Bard Pd [id number] Overnight Drainage  .... Use as directed 13)  Freestyle Test Strp (Glucose blood) .... Use 1 strip four times a day as directed 14)  Baby Aspirin 81 Mg Chew (Aspirin) .... Take 1/2 tab daily 15)  Januvia 100 Mg Tabs (Sitagliptin phosphate) .... Take 1/2 by mouth q d 16)  Vitamin D 400 Unit Caps (Cholecalciferol) 17)  Patanol 0.1 % Soln (Olopatadine hcl) .Marland Kitchen.. 1 drop each eye 2 x a day  Hypertension Assessment/Plan:      The patient's hypertensive risk group is category C: Target organ damage and/or diabetes.  Her calculated  10 year risk of coronary heart disease is 9 %.  Today's blood pressure is 130/60.  Her blood pressure goal is < 130/80.  Patient Instructions: 1)  You will be informed of lab results when available. and  follow up  2)  if  kidney function is still off then we will get a consult from nephrology . 3)  Otherwise your are doing well but avoid hypoglycemia.

## 2011-01-15 NOTE — Miscellaneous (Signed)
Summary: labs done by Dr. Allena Katz  Clinical Lists Changes  Observations: Added new observation of CREATININE: 1.32 mg/dL (78/46/9629 5:28) Added new observation of K SERUM: 4.7 meq/L (09/24/2010 8:23) Added new observation of BUN: 46 mg/dL (41/32/4401 0:27)      -  Date:  09/24/2010    K 4.7    CREAT 1.32

## 2011-01-15 NOTE — Assessment & Plan Note (Signed)
Summary: 4 month follow up/cjr pt rsc/njr   Vital Signs:  Patient profile:   77 year old female Menstrual status:  postmenopausal Weight:      146 pounds Pulse rate:   78 / minute BP sitting:   120 / 70  (left arm) Cuff size:   regular  Vitals Entered By: Romualdo Bolk, CMA (AAMA) (September 24, 2010 10:59 AM) CC: follow-up visit, Hypertension Management   History of Present Illness: Cassandra Evans  comesin today for follow up of her medical conditions  RENAL: proteinuria and renal insuff and solitay kidney.  Has seen nephrologist and apparenlty doing ok  but was told to stop ca and monitor .has stopped calcium   had elevated ca and potassium at the renal doctorand increasing fluids. Was also   supposed to be on a different diet.  DM:  No change in vision, no infections are injuries. Saw Dr Hazle Quant.She did travel to [place] and did well could've been better. She's had no low blood sugars in her diabetes is stable. LIPIDS : no se of meds   Hypertension History:      She denies headache, chest pain, palpitations, dyspnea with exertion, orthopnea, PND, peripheral edema, visual symptoms, neurologic problems, syncope, and side effects from treatment.  She notes no problems with any antihypertensive medication side effects.        Positive major cardiovascular risk factors include female age 34 years old or older, diabetes, hyperlipidemia, and hypertension.  Negative major cardiovascular risk factors include non-tobacco-user status.      Preventive Screening-Counseling & Management  Alcohol-Tobacco     Alcohol drinks/day: <1     Alcohol type: wine     Smoking Status: never  Caffeine-Diet-Exercise     Caffeine use/day: 3     Does Patient Exercise: yes     Type of exercise: aerobics and walking     Exercise (avg: min/session): 30-60     Times/week: 7  Current Medications (verified): 1)  Actos 30 Mg Tabs (Pioglitazone Hcl) .... Take 1 Tablet By Mouth Once A Day 2)  Bd U/f Iii  Short Pen Needle 31g X 8 Mm Misc (Insulin Pen Needle) .... Use As Directed 3)  Diovan 160 Mg Tabs (Valsartan) .... Take 1 Tablet By Mouth Once A Day 4)  Freestyle Lancets  Misc (Lancets) .... Use 1 Needle Four Times A Day 5)  Glipizide 5 Mg Tb24 (Glipizide) .Marland Kitchen.. 1 By Mouth Once Daily 6)  Lantus Solostar 100 Unit/ml  Soln (Insulin Glargine) .... 6-10 U Per Day As Directed 7)  Simvastatin 40 Mg Tabs (Simvastatin) .... Take 1 Tablet By Mouth At Bedtime 8)  Multiple Vitamins   Tabs (Multiple Vitamin) 9)  Freestyle Test   Strp (Glucose Blood) .... Use 1 Strip Four Times A Day As Directed 10)  Baby Aspirin 81 Mg Chew (Aspirin) .... Take 1/2 Tab Daily 11)  Januvia 100 Mg Tabs (Sitagliptin Phosphate) .... Take 1/2 By Mouth Q D 12)  Vitamin D 400 Unit Caps (Cholecalciferol) 13)  Patanol 0.1 % Soln (Olopatadine Hcl) .Marland Kitchen.. 1 Drop Each Eye 2 X A Day  Allergies (verified): No Known Drug Allergies  Past History:  Past medical, surgical, family and social histories (including risk factors) reviewed, and no changes noted (except as noted below).  Past Medical History: Diabetes mellitus, type II Hyperlipidemia Hypertension Osteoporosis Bladder cancer more than 5 years out       [facility] Dr. Hazle Quant  eye  Past Surgical  History: Reviewed history from 05/27/2008 and no changes required. Hysterectomy Cystectomy Incontinent Urinary Diversion    Past History:  Care Management: Gynecology: Dr. Tresa Res Urology in the past  Dr Logan Bores Nephrology: Allena Katz  Family History: Reviewed history from 05/08/2010 and no changes required. mom died of leukemia  at age 3   Social History: Reviewed history from 05/08/2010 and no changes required. 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    moving  NOv 11th.   Review of Systems       neg cv pulmonary neuro.  ortho   Physical Exam  Head:   normocephalic and atraumatic.   Eyes:  vision grossly intact.   Neck:  No deformities, masses, or tenderness noted. Lungs:  Normal respiratory effort, chest expands symmetrically. Lungs are clear to auscultation, no crackles or wheezes.no dullness.   Heart:  Normal rate and regular rhythm. S1 and S2 normal without gallop, murmur, click, rub or other extra sounds. Abdomen:  soft, non-tender, no hepatomegaly, and no splenomegaly.   Pulses:  pulses intact without delay   Extremities:  no clubbing cyanosis or edema  Neurologic:  alert & oriented X3, strength normal in all extremities, sensation intact to light touch, and gait normal.   l cognition Skin:  turgor normal, color normal, no ecchymoses, and no petechiae.   Cervical Nodes:  No lymphadenopathy noted Psych:  Oriented X3, normally interactive, good eye contact, not anxious appearing, and not depressed appearing.    Diabetes Management Exam:    Eye Exam:       Eye Exam done elsewhere          Date: 08/16/2010          Results: normal          Done by: Dr Hazle Quant   Impression & Recommendations:  Problem # 1:  DIABETES MELLITUS, TYPE II (ICD-250.00) disc about actos poss of inc resik fo bladder CA  .  she is aware and because she has no bladder and no active disease she choses to stay on this for now.  no se of med Her updated medication list for this problem includes:    Actos 30 Mg Tabs (Pioglitazone hcl) .Marland Kitchen... Take 1 tablet by mouth once a day    Diovan 160 Mg Tabs (Valsartan) .Marland Kitchen... Take 1 tablet by mouth once a day    Glipizide 5 Mg Tb24 (Glipizide) .Marland Kitchen... 1 by mouth once daily    Lantus Solostar 100 Unit/ml Soln (Insulin glargine) .Marland Kitchen... 6-10 u per day as directed    Baby Aspirin 81 Mg Chew (Aspirin) .Marland Kitchen... Take 1/2 tab daily    Januvia 100 Mg Tabs (Sitagliptin phosphate) .Marland Kitchen... Take 1/2 by mouth q d  Labs Reviewed: Creat: 1.6 (05/08/2010)     Last Eye Exam: normal (08/16/2010) Reviewed HgBA1c results: 7.3 (08/30/2010)  6.8  (05/01/2010)  Problem # 2:  HYPERTENSION (ICD-401.9) importance of control   confirmed  Her updated medication list for this problem includes:    Diovan 160 Mg Tabs (Valsartan) .Marland Kitchen... Take 1 tablet by mouth once a day  BP today: 120/70 Prior BP: 130/60 (05/08/2010)  Prior 10 Yr Risk Heart Disease: 9 % (09/26/2009)  Labs Reviewed: K+: 5.0 (05/08/2010) Creat: : 1.6 (05/08/2010)   Chol: 185 (08/30/2010)   HDL: 98.00 (08/30/2010)   LDL: 74 (08/30/2010)   TG: 67.0 (08/30/2010)  Problem # 3:  RENAL INSUFFICIENCY (ICD-588.9) stable and  no change from Dr Allena Katz  following   "high risk " kidney.   Problem # 4:  NEOPLASM, MALIGNANT, BLADDER, HX OF (ICD-V10.51) with cystectomy   Problem # 5:  HYPERLIPIDEMIA (ICD-272.4)  Her updated medication list for this problem includes:    Simvastatin 40 Mg Tabs (Simvastatin) .Marland Kitchen... Take 1 tablet by mouth at bedtime  Complete Medication List: 1)  Actos 30 Mg Tabs (Pioglitazone hcl) .... Take 1 tablet by mouth once a day 2)  Bd U/f Iii Short Pen Needle 31g X 8 Mm Misc (Insulin pen needle) .... Use as directed 3)  Diovan 160 Mg Tabs (Valsartan) .... Take 1 tablet by mouth once a day 4)  Freestyle Lancets Misc (Lancets) .... Use 1 needle four times a day 5)  Glipizide 5 Mg Tb24 (Glipizide) .Marland Kitchen.. 1 by mouth once daily 6)  Lantus Solostar 100 Unit/ml Soln (Insulin glargine) .... 6-10 u per day as directed 7)  Simvastatin 40 Mg Tabs (Simvastatin) .... Take 1 tablet by mouth at bedtime 8)  Multiple Vitamins Tabs (Multiple vitamin) 9)  Freestyle Test Strp (Glucose blood) .... Use 1 strip four times a day as directed 10)  Baby Aspirin 81 Mg Chew (Aspirin) .... Take 1/2 tab daily 11)  Januvia 100 Mg Tabs (Sitagliptin phosphate) .... Take 1/2 by mouth q d 12)  Vitamin D 400 Unit Caps (Cholecalciferol) 13)  Patanol 0.1 % Soln (Olopatadine hcl) .Marland Kitchen.. 1 drop each eye 2 x a day  Other Orders: Flu Vaccine 83yrs + MEDICARE PATIENTS (E9528) Administration Flu vaccine -  MCR (U1324)  Hypertension Assessment/Plan:      The patient's hypertensive risk group is category C: Target organ damage and/or diabetes.  Her calculated 10 year risk of coronary heart disease is 9 %.  Today's blood pressure is 120/70.  Her blood pressure goal is < 130/80.  Patient Instructions: 1)  no change in meds for now. 2)  Please schedule a follow-up appointment in 3 -4 months .  3)  HgBA1c prior to visit  ICD-9:  250. Flu Vaccine Consent Questions     Do you have a history of severe allergic reactions to this vaccine? no    Any prior history of allergic reactions to egg and/or gelatin? no    Do you have a sensitivity to the preservative Thimersol? no    Do you have a past history of Guillan-Barre Syndrome? no    Do you currently have an acute febrile illness? no    Have you ever had a severe reaction to latex? no    Vaccine information given and explained to patient? yes    Are you currently pregnant? no    Lot Number:[id number]   Exp Date:06/15/2011   Site Given  Left Deltoid IM     [email or web address] Romualdo Bolk, CMA (AAMA)  September 24, 2010 11:04 AM

## 2011-01-15 NOTE — Consult Note (Signed)
Summary: [facility]   Imported By: Maryln Gottron 08/27/2010 15:45:47  _____________________________________________________________________  External Attachment:    Type:   Image     Comment:   External Document

## 2011-01-15 NOTE — Progress Notes (Signed)
Summary: refill  Phone Note Call from Patient Call back at Home Phone [phone]   Caller: Patient Summary of Call: Pt needs a refill on simvastatin 40mg  sent [facility]. Labs and appt in May. Initial call taken by: Romualdo Bolk, CMA Duncan Dull),  Apr 20, 2010 3:51 PM  Follow-up for Phone Call        Rx sent to [facility]. Follow-up by: Romualdo Bolk, CMA Duncan Dull),  Apr 20, 2010 3:52 PM    Prescriptions: SIMVASTATIN 40 MG TABS (SIMVASTATIN) Take 1 tablet by mouth at bedtime  #90 x 0   Entered by:   Romualdo Bolk, CMA (AAMA)   Authorized by:   Madelin Headings MD   Signed by:   Romualdo Bolk, CMA (AAMA) on 04/20/2010   Method used:   Electronically to        [facility]* (mail-order)             ,          Ph: [phone]       Fax: [phone]   RxID:   [id number]   Appended Document: refill    Clinical Lists Changes  Medications: Rx of LANTUS SOLOSTAR 100 UNIT/ML  SOLN (INSULIN GLARGINE) 6-10 u per day as directed;  #3 boxes x 0;  Signed;  Entered by: Romualdo Bolk, CMA (AAMA);  Authorized by: Madelin Headings MD;  Method used: Electronically to [facility] , ,   , Ph: [phone], Fax: [phone]    Prescriptions: LANTUS SOLOSTAR 100 UNIT/ML  SOLN (INSULIN GLARGINE) 6-10 u per day as directed  #3 boxes x 0   Entered by:   Romualdo Bolk, CMA (AAMA)   Authorized by:   Madelin Headings MD   Signed by:   Romualdo Bolk, CMA (AAMA) on 04/20/2010   Method used:   Electronically to        [facility]* (mail-order)             ,          Ph: [phone]       Fax: [phone]   RxID:   [id number]

## 2011-01-17 NOTE — Assessment & Plan Note (Signed)
Summary: 3 month rov/njr   Vital Signs:  Patient profile:   77 year old female Menstrual status:  postmenopausal Height:      60.5 inches Weight:      141 pounds BMI:     27.18 O2 Sat:      98 % on Room air Pulse rate:   91 / minute BP sitting:   120 / 80  (right arm) Cuff size:   regular  Vitals Entered By: Romualdo Bolk, CMA (AAMA) (January 04, 2011 10:20 AM)  O2 Flow:  Room air CC: Follow-up visit on labs, Hypertension Management   History of Present Illness: Cassandra Evans comes in today  for follow up of multiple medical problems   DM doing much bettre and has  lost some weight with this and on low dose lantus.  still taking actos with out new se. has seen  kidney specialist  and  following.  HT controlled and nnose of meds No change in vision injury Cp sob or pulmonary problems . No numbness. disc intermittent recent  right leg pain knee ? thigh   at night ocassonal.   not  when exercising   no fever and not limting..  no groin pain now.   back ok.    No injury or fall. LIPIDs NO se of meds   Hypertension History:      She denies headache, chest pain, palpitations, dyspnea with exertion, orthopnea, PND, peripheral edema, visual symptoms, neurologic problems, syncope, and side effects from treatment.  She notes no problems with any antihypertensive medication side effects.        Positive major cardiovascular risk factors include female age 67 years old or older, diabetes, hyperlipidemia, and hypertension.  Negative major cardiovascular risk factors include non-tobacco-user status.      Preventive Screening-Counseling & Management  Alcohol-Tobacco     Alcohol drinks/day: <1     Alcohol type: wine     Smoking Status: never  Caffeine-Diet-Exercise     Caffeine use/day: 3     Does Patient Exercise: yes     Type of exercise: aerobics and walking     Exercise (avg: min/session): 30-60     Times/week: 7  Current Medications (verified): 1)  Actos 30 Mg Tabs  (Pioglitazone Hcl) .... Take 1 Tablet By Mouth Once A Day 2)  Bd U/f Iii Short Pen Needle 31g X 8 Mm Misc (Insulin Pen Needle) .... Use As Directed 3)  Diovan 160 Mg Tabs (Valsartan) .... Take 1 Tablet By Mouth Once A Day 4)  Freestyle Lancets  Misc (Lancets) .... Use 1 Needle Four Times A Day 5)  Glipizide 5 Mg Tb24 (Glipizide) .Marland Kitchen.. 1 By Mouth Once Daily 6)  Lantus Solostar 100 Unit/ml  Soln (Insulin Glargine) .... 6-10 U Per Day As Directed 7)  Simvastatin 40 Mg Tabs (Simvastatin) .... Take 1 Tablet By Mouth At Bedtime 8)  Multiple Vitamins   Tabs (Multiple Vitamin) 9)  Freestyle Test   Strp (Glucose Blood) .... Use 1 Strip Four Times A Day As Directed 10)  Baby Aspirin 81 Mg Chew (Aspirin) .... Take 1/2 Tab Daily 11)  Januvia 100 Mg Tabs (Sitagliptin Phosphate) .... Take 1/2 By Mouth Q D 12)  Vitamin D 400 Unit Caps (Cholecalciferol) 13)  Patanol 0.1 % Soln (Olopatadine Hcl) .Marland Kitchen.. 1 Drop Each Eye 2 X A Day  Allergies (verified): No Known Drug Allergies  Past History:  Past medical, surgical, family and social histories (including risk factors) reviewed, and no  changes noted (except as noted below).  Past Medical History: Reviewed history from 09/24/2010 and no changes required. Diabetes mellitus, type II Hyperlipidemia Hypertension Osteoporosis Bladder cancer more than 5 years out       [facility] Dr. Hazle Quant  eye  Past Surgical History: Reviewed history from 05/27/2008 and no changes required. Hysterectomy Cystectomy Incontinent Urinary Diversion    Past History:  Care Management: Gynecology: Dr. Tresa Res Urology in the past  Dr Logan Bores Nephrology: Allena Katz  Family History: Reviewed history from 05/08/2010 and no changes required. mom died of leukemia  at age 21   Social History: Reviewed history from 09/24/2010 and no changes required. 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  Review of Systems  The patient denies anorexia, fever, weight gain, vision loss, decreased hearing, chest pain, syncope, dyspnea on exertion, peripheral edema, prolonged cough, severe indigestion/heartburn, transient blindness, difficulty walking, abnormal bleeding, enlarged lymph nodes, and angioedema.    Physical Exam  General:  alert, well-developed, well-nourished, and well-hydrated.   Head:  normocephalic.   Eyes:  vision grossly intact.  glasses  Neck:  No deformities, masses, or tenderness noted. Lungs:  Normal respiratory effort, chest expands symmetrically. Lungs are clear to auscultation, no crackles or wheezes.no dullness.   Heart:  Normal rate and regular rhythm. S1 and S2 normal without gallop, murmur, click, rub or other extra sounds. Abdomen:  soft, non-tender, no hepatomegaly, and no splenomegaly.   Msk:  no joint warmth and no redness over joints.    hhip nl gait nl rom ok knee  ni effusion  Pulses:  pulses intact without delay   Extremities:  no clubbing cyanosis or edema  Neurologic:  alert & oriented X3 and gait normal.  non focal  Pt is A&Ox3,affect,speech,memory,attention,&motor skills appear intact.  Skin:  turgor normal, color normal, no ecchymoses, and no petechiae.   Cervical Nodes:  No lymphadenopathy noted Psych:  Oriented X3, normally interactive, good eye contact, not anxious appearing, and not depressed appearing.    Diabetes Management Exam:    Foot Exam (with socks and/or shoes not present):       Sensory-Monofilament:          Left foot: normal          Right foot: normal       Inspection:          Left foot: normal          Right foot: normal       Nails:          Left foot: normal          Right foot: normal   Impression & Recommendations:  Problem # 1:  DIABETES MELLITUS, TYPE II (ICD-250.00) Assessment Improved plang to continue on actos and currentregimen and continue lifestyle intervention  Her updated  medication list for this problem includes:    Actos 30 Mg Tabs (Pioglitazone hcl) .Marland Kitchen... Take 1 tablet by mouth once a day    Diovan 160 Mg Tabs (Valsartan) .Marland Kitchen... Take 1 tablet by mouth once a day    Glipizide 5 Mg Tb24 (Glipizide) .Marland Kitchen... 1 by mouth once daily    Lantus Solostar 100 Unit/ml Soln (Insulin glargine) .Marland Kitchen... 6-10 u per day as directed    Baby Aspirin 81 Mg Chew (Aspirin) .Marland Kitchen... Take 1/2 tab daily    Januvia  100 Mg Tabs (Sitagliptin phosphate) .Marland Kitchen... Take 1/2 by mouth q d  Labs Reviewed: Creat: 1.32 (09/24/2010)     Last Eye Exam: normal (08/16/2010) Reviewed HgBA1c results: 7.0 (12/28/2010)  7.3 (08/30/2010)  Problem # 2:  HYPERTENSION (ICD-401.9)  Her updated medication list for this problem includes:    Diovan 160 Mg Tabs (Valsartan) .Marland Kitchen... Take 1 tablet by mouth once a day  BP today: 120/80 Prior BP: 120/70 (09/24/2010)  Prior 10 Yr Risk Heart Disease: 9 % (09/26/2009)  Labs Reviewed: K+: 4.7 (09/24/2010) Creat: : 1.32 (09/24/2010)   Chol: 185 (08/30/2010)   HDL: 98.00 (08/30/2010)   LDL: 74 (08/30/2010)   TG: 67.0 (08/30/2010)  Problem # 3:  RENAL INSUFFICIENCY (ICD-588.9) stable following   Problem # 4:  LEG PAIN, RIGHT (ICD-729.5) Assessment: New nl exam   ? from knee or hip   or back but is very transient   but rec  eval if persistent or  progressive   with ortho.   Complete Medication List: 1)  Actos 30 Mg Tabs (Pioglitazone hcl) .... Take 1 tablet by mouth once a day 2)  Bd U/f Iii Short Pen Needle 31g X 8 Mm Misc (Insulin pen needle) .... Use as directed 3)  Diovan 160 Mg Tabs (Valsartan) .... Take 1 tablet by mouth once a day 4)  Freestyle Lancets Misc (Lancets) .... Use 1 needle four times a day 5)  Glipizide 5 Mg Tb24 (Glipizide) .Marland Kitchen.. 1 by mouth once daily 6)  Lantus Solostar 100 Unit/ml Soln (Insulin glargine) .... 6-10 u per day as directed 7)  Simvastatin 40 Mg Tabs (Simvastatin) .... Take 1 tablet by mouth at bedtime 8)  Multiple Vitamins Tabs  (Multiple vitamin) 9)  Freestyle Test Strp (Glucose blood) .... Use 1 strip four times a day as directed 10)  Baby Aspirin 81 Mg Chew (Aspirin) .... Take 1/2 tab daily 11)  Januvia 100 Mg Tabs (Sitagliptin phosphate) .... Take 1/2 by mouth q d 12)  Vitamin D 400 Unit Caps (Cholecalciferol) 13)  Patanol 0.1 % Soln (Olopatadine hcl) .Marland Kitchen.. 1 drop each eye 2 x a day  Hypertension Assessment/Plan:      The patient's hypertensive risk group is category C: Target organ damage and/or diabetes.  Her calculated 10 year risk of coronary heart disease is 9 %.  Today's blood pressure is 120/80.  Her blood pressure goal is < 130/80.  Patient Instructions: 1)  HgBA1c prior to visit  ICD-9:  2)  May 2012 . 3)  Call if  leg knee pain persistent or  progressive  and consider further evaluation.  Prescriptions: DIOVAN 160 MG TABS (VALSARTAN) Take 1 tablet by mouth once a day  #90 x 3   Entered and Authorized by:   Madelin Headings MD   Signed by:   Madelin Headings MD on 01/04/2011   Method used:   Electronically to        [facility]* (retail)             ,          Ph: [phone]       Fax: [phone]   RxID:   [id number] ACTOS 30 MG TABS (PIOGLITAZONE HCL) Take 1 tablet by mouth once a day  #90 x 3   Entered and Authorized by:   Madelin Headings MD   Signed by:   Madelin Headings MD on 01/04/2011   Method used:   Electronically to  [facility]* (retail)             ,          Ph: [phone]       Fax: [phone]   RxID:   [id number]    Orders Added: 1)  Est. Patient Level IV [24401]

## 2011-01-22 ENCOUNTER — Other Ambulatory Visit: Payer: Self-pay | Admitting: *Deleted

## 2011-01-22 MED ORDER — INSULIN PEN NEEDLE 31G X 8 MM MISC: Status: DC

## 2011-03-13 ENCOUNTER — Other Ambulatory Visit: Payer: Self-pay | Admitting: Internal Medicine

## 2011-03-26 ENCOUNTER — Other Ambulatory Visit: Payer: Self-pay | Admitting: Internal Medicine

## 2011-04-30 ENCOUNTER — Other Ambulatory Visit: Payer: Self-pay

## 2011-05-02 ENCOUNTER — Encounter: Payer: Self-pay | Admitting: Internal Medicine

## 2011-05-02 ENCOUNTER — Other Ambulatory Visit (INDEPENDENT_AMBULATORY_CARE_PROVIDER_SITE_OTHER): Payer: Medicare Other | Admitting: Internal Medicine

## 2011-05-02 LAB — HEMOGLOBIN A1C: Hgb A1c MFr Bld: 6.9 % — ABNORMAL HIGH (ref 4.6–6.5)

## 2011-05-03 NOTE — Discharge Summary (Signed)
[facility]  Patient:    Cassandra Evans, Cassandra Evans Visit Number: [id number] MRN: [id number]          Service Type: SUR Location: [id number] Attending Physician:  Londell Moh Dictated by:   Jamison Neighbor, M.D. Admit Date:  08/31/2001 Discharge Date: 09/08/2001   CC:         Cassandra Evans. Cassandra Evans, M.D. [facility]  [name], M.D.   Discharge Summary  ADMISSION DIAGNOSIS:  Transitional cell carcinoma of the bladder.  SECONDARY DIAGNOSES: 1. Hyperlipidemia. 2. Hypertension. 3. Diabetes.  DISCHARGE DIAGNOSES: 1. Transitional cell carcinoma. 2. Hyperlipidemia. 3. Hypertension. 4. Diabetes.  PRINCIPAL PROCEDURE:  Cystectomy and ileoconduit diversion on the day of admission.  HISTORY:  This 77 year old female had persistent right-sided abdominal pain and underwent a vaginal hysterectomy.  In the postoperative period, it was clear that the patient still had pain.  A CT scan was obtained, which showed some degree of hydronephrosis.  There was no obvious mass noted.  The patient underwent a retrograde study, which showed a transitional cell carcinoma of the bladder.  The patient underwent resection and was found to have muscle invasive disease.  She underwent a repeat CT scan, which did show evidence of a mass in the posterior edge of the bladder and what appeared to be possible iliac nodes.  After careful discussion with medical oncology and radiation oncology, as well as with the family, the decision was made to proceed with cystectomy and then plan for additional chemotherapy and irradiation if appropriate.  PAST MEDICAL HISTORY:  Hyperlipidemia and hypertension.  MEDICATIONS AT TIME OF ADMISSION: 1. Altace. 2. Fosamax. 3. Glucophage. 4. Glucotrol. 5. Zocor. 6. Premarin. 7. Tylenol.  HOSPITAL COURSE:  The patient had a full bowel prep including appropriate antibiotics.  She was taken to the operating room on September 01, 2001  where she underwent cystectomy, ileoconduit, appendectomy, bilateral salpingo-oophorectomy, and a pelvic node dissection which was extended up along the iliac to the bifurcation.  The patient had an unremarkable postoperative course.  She did receive 2 units of blood because of a very slight drop in her hemoglobin.  The patient had one episode of sundowning but then after she got out of the intensive care unit was quite stable.  She was rapidly advanced to a regular diet, which she tolerated without difficulty. She had her first bowel movement on postoperative day #4.  The patient was eventually taken off her IV fluids and pain medication.  Staples were taken out on September 08, 2001.  The stoma looked great.  The patient tolerated this without difficulty.  She was ready for discharge at that time.  FOLLOW-UP:  She will return in follow-up in one week, at which time the stents will be removed. Dictated by:   Jamison Neighbor, M.D. Attending Physician:  Londell Moh DD:  09/15/01 TD:  09/15/01 Job: [id number]

## 2011-05-03 NOTE — Op Note (Signed)
[facility]  Patient:    Cassandra Evans, Cassandra Evans Visit Number: [id number] MRN: [id number]          Service Type: Attending:  Jamison Neighbor, M.D. Proc. Date: 08/07/01   CC:         [name], M.D.   Operative Report  PREOPERATIVE DIAGNOSIS:  Right hydronephrosis.  POSTOPERATIVE DIAGNOSIS:  Right hydronephrosis secondary to probable distal ureteral / bladder tumor, rule out transitional cell carcinoma.  PROCEDURE:  Cystoscopy and TURB.  SURGEON:  Jamison Neighbor, M.D.  ANESTHESIA:  General.  COMPLICATIONS:  None.  DRAINS:  8 French Foley catheter for bladder irrigation.  BRIEF HISTORY::  This 77 year old female had some right-sided abdominal pain. She was evaluated, and it was thought to be due to problems with her uterus. She underwent a vaginal hysterectomy with anterior and posterior repair.  The patient had postoperative pain, and a CT scan revealed hydronephrosis. This showed some tethering of small bowel in the region of the obstruction, but the definitive level of the obstruction could not be identified.  The patient notes that this was the pain she had prior to the hysterectomy and does not appear to have caused by the hysterectomy in any way.  The patient is now to undergo retrograde studies to evaluate the ureter and hopefully place a stent to decompress the kidney.  The patient understands the risks and benefits of the procedure and gave full and informed consent.  DESCRIPTION OF PROCEDURE:  After the successful induction of general anesthesia, the patient was placed in the dorsal lithotomy position and prepped with Betadine and draped in the usual sterile fashion.  The cystoscope was inserted, and the bladder was carefully inspected.  The left side showed a completely normal ureter.  On the right-hand side, there was an irregular mass sitting in the general area of the right ureter.  What appeared to be the right ureteral  orifice could be identified; however, the area just behind that was somewhat necrotic in its appearance, and it did not appear to be an intact ureter.  Attempts at passing a catheter in that area were unsuccessful.  While it did appear that perhaps the ureter had been cannulated, the retrograde study showed that there was extravasation of contrast.  The resectoscope was inserted, and the area was resected so that an adequate specimen could be sent for analysis.  The patient had indigo carmine given.  The left ureter worked perfectly.  No dye was seen to come from anywhere within that region on the right-hand side.  The decision was made to place a 24 [place] three-way catheter and monitor the patient with 23-hour observation and obtain a new CT scan with thin cuts in that area, specifically looking for evidence of a transitional cell carcinoma of the distal ureter.  The patient tolerated the procedure well and was taken to the recovery room in good condition. Attending:  Jamison Neighbor, M.D. DD:  08/07/01 TD:  08/08/01 Job: [phone]

## 2011-05-03 NOTE — Op Note (Signed)
[facility]  Patient:    Cassandra Evans, Cassandra Evans Visit Number: [id number] MRN: [id number]          Service Type: SUR Location: 2S 0257 01 Attending Physician:  Londell Moh Dictated by:   Jamison Neighbor, M.D. Proc. Date: 09/01/01 Admit Date:  08/31/2001   CC:         [name], M.D.  Neta Mends. Panosh, M.D. [facility]   Operative Report  PREOPERATIVE DIAGNOSIS:  Transitional cell carcinoma of the bladder.  POSTOPERATIVE DIAGNOSIS:  Transitional cell carcinoma of the bladder.  OPERATION PERFORMED:  Radical cystectomy with bilateral pelvic lymph node dissection, bilateral salpingo-oophorectomy, incidental appendectomy, and ileal conduit urinary diversion.  SURGEON:  Jamison Neighbor, M.D.  ANESTHESIA:  General plus epidural.  COMPLICATIONS:  None.  DRAINS: 1. A 10 French Jackson-Pratt drain in pelvis. 2. Bilateral single Js in ileal conduit. 3. Nasogastric tube.  ESTIMATED BLOOD LOSS:  450 cc.  INDICATIONS FOR PROCEDURE:  This 77 year old female has had right-sided lower abdominal pain.  The patient recently underwent a vaginal hysterectomy and in the postoperative period still complained of pain on the right-handed side. Dr. Amedeo Kinsman appropriately ordered a CT scan which revealed hydronephrosis but no obvious mass lesion.  The patient was referred for retrograde evaluation.  A retrograde was done and it was clear upon cystoscopic examination that the obstruction was not caused by any problems associated with the hysterectomy, but was caused by previously undiagnosed transitional cell carcinoma of the bladder.  The patients history certainly was not suggest of transitional cell carcinoma of the bladder as she had had multiple negative urinalyses and has never suffered from gross hematuria or any documented urgency or frequency.  A follow-up CAT scan after the TURB did show that there was a mass in the posterior aspect of the  bladder on the right hand side obstructing the ureter and there were questionable nodes in that region.  These findings were discussed with the patient at some length and consideration was given to multiple possibilities for therapy.  The medical oncologists were contacted to discuss the potential benefits of adjuvant versus neoadjuvant therapy.  It was felt that the patient might benefit from chemotherapy if the nodes were positive but without obvious or definite proof of metastatic disease, the benefit of a neoadjuvant approach was certainly not clear.  Radiation Oncology felt that there was no ____________ a preoperative radiation but that if there was bulky disease left, that postoperative radiation might prove helpful.  After discussing with the patient as well as with her son, who is a critical care medicine fellow at the [employer] in [place], the decision was made to proceed with cystectomy and plan for postoperative chemotherapy if nodes are positive.  The decision as to the best type of diversion was delayed until the day prior to surgery.  The patient went back and forth as to whether she would prefer to have a standard diversion or an [place] pouch urinary diversion.  The pros and cons of these were carefully described to her and she finally decided she did not feel that she wanted to commit to in and out catheterization particularly if she might require chemotherapy.  For that reason she agreed to undergo a standard ileal conduit diversion.  She understands the risks and benefits of the procedure. She specifically realizes that there is no guarantee that surgery will be obtainable and that additional therapy such as chemotherapy or radiation might prove necessary.  She  gave full and informed consent.  The patient had received a full bowel prep.  She has been given appropriate antibiotic therapy preoperatively and has two units of blood available for the  procedure.  For pain management purposes the patient did elect to have an epidural placed which was done prior to anesthesia.  DESCRIPTION OF PROCEDURE:  After successful induction of general anesthesia, which was given in addition to her epidural, the patient was placed in a low lithotomy position and prepped with Betadine and draped in the usual sterile fashion.  A Foley catheter was inserted and placed to straight drainage.  An incision was made in the lower midline and extended to a point approximately one third of the way between the umbilicus and the xiphoid process.  This was carried down sharply through the subcutaneous tissues until the rectus sheath was identified.  The rectus sheath was opened in the midline.  Entry in to the abdominal cavity was obtained above the umbilicus.  As the peritoneum was reflected, care was taken to ensure that a wide flap of peritoneum was taken off the underside of the abdomen so that the obliterated umbilical vessels were taken with the specimen.  This dissection was taken all the way down to the round ligaments which were carefully taken down on both sides with the Ligasure.  The remnants of the tube and ovaries were identified and the ovarian vessels were taken down bilaterally.  The tube and ovaries that were left from the patients previous hysterectomy were then taken as separate specimens.  The ureters were identified on each side.  The ureter on the left side was of normal caliber. It was dissected free from the surrounding tissues out lateral to the sigmoid colon and was transected.  A section of the tip was sent down for pathology and Marcie Bal, M.D. noted there were no signs of any tumor.  On the right hand side the ureter was dilated.  It was also dissected free from the surrounding tissues and freed down all the way to the bladder.  It was also transected and fortunately this ureter also proved to be  clear.  A careful inspection  in the area of the iliac veins did not reveal any obvious adenopathy despite the findings noted on preoperative CT.  The mass on the right hand side could be identified. The pedicles were carefully taken down on each side using the Ligasure.  Once the superior vesical pedicles had been carefully controlled, the back of the vagina was opened and the pedicles were taken down to include the top portion of the vagina.  This was felt to be necessary in order to ensure that adequate margins were obtained.  On the right hand side, dissection was taken down as far laterally as possible so that the dissection was taken all the way out to the external iliac vessels and to the obturator nerve.  A large number of nodes were taken with this.  On the left hand side the incision was not taken quite as widely.  After the pedicles were taken down, the specimen was mobilized down to the point where just the urethra and endopelvic fascia were left.  The endopelvic fascia was opened with electrocautery.  The connections to the urethra were taken down with the Ligasure and the specimen was delivered.  The entire area was inspected and no gross tumor was seen to have been left behind.  The inspection showed the rectum had not been injured and  the vagina was intact on the posterior wall.  The urethral opening was then closed with a series of 2-0 Vicryl sutures.  2-0 Vicryl was used to close the vagina which had  adequate length but was quite narrowed.  The patient had been warned about this preoperatively.  The careful inspection ____________  that adequate hemostasis had been obtained.  The remaining nodal tissue that had not been taken out directly with the specimen was removed on each side freeing up the external iliac vein and obturator nerve bilaterally.  Clips were used where appropriate.  The specimens were sent for permanent section.  Attention was then directed to the ileal conduit.  A section of bowel  was harvested in the usual fashion using a piece of ileum with an adequate blood supply.  The GIA was used to transect the bowel and the mesentery was mobilized enough to perform an appropriate reanastomosis.  The reanastomosis was performed using a GIA and a TA-55 instruments.  The mesentery traps were closed with a series of silk sutures.  The iliac conduit was then opened at one end and openings were made for the ureters to be placed.  The ureters were spatulated and then anastomosed to the conduit in the usual fashion with a series of 4-0 Vicryl sutures.  These anastomoses were done over single-J catheters.  Single-Js were sutured to the ureter on each side with a 5-0 chromic.  Care was taken to ensure that the ureters were appropriately placed and there was no angulation or twisting of the conduit and when the anastomosis was performed, it appeared that the ureters had not been kinked or angulated in any way and that the anastomoses were intact.  The patients previous pre-marked stoma site was then opened and some of the underlying subcutaneous fat was removed.  A cruciate incision was made in the rectus abdominis sheath.  Anchoring sutures of 2-0 Vicryl were placed and after the conduit was pulled up and through, the conduit was appropriately anchored.  The conduit was then matured in the usual fashion with ____________ sutures of 3-0 Vicryl.  A final inspection showed that adequate hemostasis had been obtained.  The patient had a single Jackson-Pratt drain placed in the pelvis.  The fascia was closed with a running suture of #1 PDS.  The patient tolerated the procedure well and was taken to the recovery room in good condition.  Estimated blood loss for this procedure was 450 cc. Dictated by:   Jamison Neighbor, M.D. Attending Physician:  Londell Moh DD:  09/01/01 TD:  09/01/01 Job: [id number]

## 2011-05-03 NOTE — Discharge Summary (Signed)
[facility]  Patient:    Cassandra Evans, Cassandra Evans                    MRN: [id number] Adm. Date:  [id number] Disc. Date: [id number] Attending:  Brynda Peon                           Discharge Summary  DISCHARGE DIAGNOSES: 1. Pelvic relaxation. 2. Uterine fibroid. 3. Pelvic pain.  PROCEDURES:  Total vaginal hysterectomy, anterior and posterior repair.  HISTORY OF PRESENT ILLNESS:  This is a 77 year old married white female, gravida 2, para 2, with a symptomatic cystocele, rectocele, and uterine prolapse, with a known 2.3 cm fibroid off the right lower uterine segment, admitted for total vaginal hysterectomy and anterior and posterior repair.  HOSPITAL COURSE:  On February 09, 2001, she underwent the above procedure. She had an uneventful postoperative course and was sent home on postoperative day #2 afebrile and in good condition.  She did leave the hospital with a Bonnano catheter with full instructions regarding voiding trials, and to call the office each morning with her voided and retained amounts.  She was given a prescription for Percocet #20, 1 p.o. q.4h. p.r.n. pain, and Premarin 0.3 mg 1 p.o. q.d.; the 0.3 mg dose would be the only dose that the patient would agree to.  LABORATORY DATA:  Admission H&H 12 and 34.8, on discharge 10.5 and 30.0. Pathology report showed an unremarkable cervix and leaky proliferative endometrium with a leiomyomata and some serosal fibrous adhesions.DD:  03/02/01 TD:  03/02/01 Job: [id number]

## 2011-05-03 NOTE — Op Note (Signed)
[facility]  Patient:    Cassandra Evans, Cassandra Evans                    MRN: [id number] Proc. Date: 02/09/01 Adm. Date:  [id number] Attending:  Brynda Peon                           Operative Report  PREOPERATIVE DIAGNOSIS:  Symptomatic pelvic relaxation with small uterine fibroid.  POSTOPERATIVE DIAGNOSIS:  Symptomatic pelvic relaxation with small uterine fibroid.  PROCEDURE:  Total vaginal hysterectomy, anterior and posterior repair.  SURGEON:  Dr. Amedeo Kinsman.  ASSISTANT:  Dr. Katherine Mantle.  ANESTHESIA:  General endotracheal.  ESTIMATED BLOOD LOSS:  250 cc.  COMPLICATIONS:  None.  DESCRIPTION OF PROCEDURE:  The patient was taken to the operating room and after the induction of adequate general endotracheal anesthesia was prepped and draped in the usual fashion. The cervix was grasped on its anterior lip with the [facility] tenaculum. Then 10 cc of 1% xylocaine were injected submucosally. The vaginal mucosa was incised and pushed back off the cervix using sharp and blunt dissection. Attention was next turned posteriorly. A posterior colpotomy incision was made. Initially this was felt to be too far posteriorly and some perirectal fat was noticed when the incision was made. The operative finger was inserted into the patients rectum. The incision did not go through the rectum and the mucosa was reapproximated with interrupted sutures of 2-0 Vicryl. The colpotomy incision was made a little more anteriorly towards the cervix and the peritoneal cavity was entered without difficulty. A bonnano retractor was placed. The uterosacral ligaments were clamped, cut, and tied with #0 Vicryl. The cardinal ligaments were also then taken with a bite with the curved Heaney clamping, cutting, and tying in sequence. Attention was next turned anteriorly. The fibroid was approximately 2 cm and was anteriorly on the patients left. It was somewhat distorting  the anatomy. The peritoneum was elevated and entered with Metzenbaum scissors and the anterior retractor was placed. Another bite was taken up the cardinal ligament, clamping, cutting and typing with #0 Vicryl. The pedicle containing the utero-ovarian ligament, the tube and a round ligament was then clamped on both sides, cut and doubly tied. Hemostasis was noted. The specimen was sent to pathology. The tubes and ovaries were inspected and were normal. The ovaries were up high near the lateral pelvic side wall and were not able to be safely reached in order to do a BSO. Therefore the ovaries were left in place. The vaginal cuff was run with 2-0 Vicryl with a running locking suture from 2 oclock to 10 oclock incorporating the peritoneum. Attention was next turned to the anterior repair. A Foley catheter was inserted because the bladder was getting full. The vaginal mucosa was undermined with Metzenbaum scissors and opened up to approximately 1 cm from the urethral meatus. The vaginal mucosa was grasped with Allis on the sides going up. The fascia was separated from the mucosa with sharp dissection and the plication of the bladder was carried out using interrupted sutures of 2-0 Vicryl. The pubourethral ligaments were then grasped on either side with Kochers and a stitch was put through the pubourethral ligament to elevate the urethra. This was tied beneath the urethra. The Foley catheter was then moved through the urethra and was noted to move freely. The excess vaginal mucosa was excised and the vagina was closed with interrupted sutures of 2-0 Vicryl. The  vaginal cuff was then also closed closing it side to side to try and maintain vaginal length. Even at the very start of the procedure, it was noted that the patient had a very short vagina and maintenance of the vaginal length was paramount in the operators mind. After the vaginal cuff was closed, hemostasis was noted and attention was  next turned to the posterior repair. Allis were used to mark the margin of the repair and the vaginal mucosa was elevated in the midline with an Allis. The vaginal mucosa was undermined with Metzenbaum scissors and the underlying fascia was dissected off the vaginal mucosa with sharp dissection. The vaginal mucosa was extremely thin posteriorly and it was quite a difficult dissection to separate the fascia off the vaginal mucosa. The rectum was embrocated with sutures across the fascia using 2-0 Vicryl. Two interrupted sutures were used to build up the perineal body. The excess vaginal mucosa, which was minimal, was excised and the vagina was closed with interrupted sutures of 2-0 Vicryl. The perineum was closed with 2-0 Vicryl. The vagina was then packed with 1 inch plain gauze soaked in Estrace cream. The bladder was filled with 400 cc of methylene blue stain sterile saline. A bonnano catheter was placed and tied in with four sutures of 2-0 silk. The Foley was then used to drain the bladder and then removed and the procedure was terminated. The patient tolerated it well and went in satisfactory condition to post anesthesia recovery. DD:  02/09/01 TD:  02/10/01 Job: [id number]

## 2011-05-03 NOTE — H&P (Signed)
[facility]  Patient:    Cassandra Evans, Cassandra Evans Visit Number: [id number] MRN: [id number]          Service Type: SUR Location: 2S 0257 01 Attending Physician:  Londell Moh Dictated by:   Jamison Neighbor, M.D. Admit Date:  08/31/2001   CC:         [name], M.D. [facility]  [name], M.D.   History and Physical  ADMISSION DIAGNOSIS:  Transitional cell carcinoma of the bladder.  HISTORY:  This 77 year old female had persistent right-sided abdominal pain and eventually underwent a vaginal hysterectomy.  In the postoperative period, the pain was still present and a referral for urology was made because a CT scan showed hydronephrosis.  It should be noted that that CT scan did not show any obvious mass.  The patient underwent a ______ study, which demonstrated that the obstruction was not caused by anything that occurred during the hysterectomy but was caused by a transitional cell carcinoma of the bladder. The patient underwent resection but the ureter did not open up completely. The pathology report showed a muscle invasive transitional cell carcinoma of the bladder.  The patient had staging studies, which demonstrate possible nodes in the iliac chain and did demonstrate a 4 cm mass in the posterior aspect of the bladder.  After careful discussion with the patient, her husband, her son who is a physician at the [employer] at [place], as well as consultation with hematology/oncology and radiation oncology, the decision was made to proceed with a cystectomy.  The patient may require adjuvant chemotherapy but it was not felt that the potential benefits of neoadjuvant therapy were enough to warrant delaying her procedure.  The patient has elected to undergo an ileoconduit as opposed to ending in a pouch but does realize that at a future date, if she should change her mind, that can be converted.  The patient gave full and  informed consent.  She is to be admitted one day prior to the procedure to complete a bowel prep.  PAST MEDICAL HISTORY:  Hyperlipidemia and hypertension.  PAST SURGICAL HISTORY:  TVH with anterior and posterior repair on February 09, 2001.  She had a right breast biopsy done over the years that was benign.  She had a cystoscopy and TURB performed on September 07, 2001.  MEDICATIONS: 1. Altace 5 mg daily. 2. Fosamax 1 daily. 3. Glucophage 500 mg b.i.d. for her diabetes. 4. Glucotrol XL 5 mg 2 in the morning and 1 in the evening. 5. Zocor 10 mg q.h.s. 6. Premarin 0.3 mg daily. 7. Tylenol P.M. on an as needed basis.  REVIEW OF SYSTEMS:  She does have glasses that she wears for impaired vision but otherwise has no ongoing cardiac, pulmonary, musculoskeletal, neurologic, endocrine, vascular, gastrointestinal, dermatologic, or urologic disorders other than that described above.  PHYSICAL EXAMINATION:  GENERAL:  She is a well-developed, well-nourished female in no acute distress.  HEENT:  Normocephalic, atraumatic.  Cranial nerves II-XII were grossly intact.  NECK:  Supple.  No adenopathy or thyromegaly.  LUNGS:  Clear.  HEART:  Regular rate and rhythm.  No murmurs, thrills, gallops, rubs, or heaves.  ABDOMEN:  Soft, nontender with no palpable masses, rebound, or guarding.  PELVIC:  Pelvic examination does not reveal an obvious mass.  She has good support of the urethra following the anterior and posterior repair.  EXTREMITIES:  No cyanosis, clubbing, or edema.  NEUROLOGIC:  Appears grossly intact.  IMPRESSION:  Transitional cell  carcinoma of the bladder.  PLAN:  Admit the patient for bowel prep in preparation for planned radical cystectomy with conduit diversion to be formed on September 07, 2001. Dictated by:   Jamison Neighbor, M.D. Attending Physician:  Londell Moh DD:  09/01/01 TD:  09/01/01 Job: [id number]

## 2011-05-07 ENCOUNTER — Encounter: Payer: Self-pay | Admitting: Internal Medicine

## 2011-05-07 ENCOUNTER — Ambulatory Visit (INDEPENDENT_AMBULATORY_CARE_PROVIDER_SITE_OTHER): Payer: Medicare Other | Admitting: Internal Medicine

## 2011-05-07 DIAGNOSIS — Z8551 Personal history of malignant neoplasm of bladder: Secondary | ICD-10-CM

## 2011-05-07 DIAGNOSIS — D485 Neoplasm of uncertain behavior of skin: Secondary | ICD-10-CM

## 2011-05-07 DIAGNOSIS — E119 Type 2 diabetes mellitus without complications: Secondary | ICD-10-CM

## 2011-05-07 DIAGNOSIS — I10 Essential (primary) hypertension: Secondary | ICD-10-CM

## 2011-05-07 DIAGNOSIS — N259 Disorder resulting from impaired renal tubular function, unspecified: Secondary | ICD-10-CM

## 2011-05-07 MED ORDER — SIMVASTATIN 40 MG PO TABS: 40.0000 mg | ORAL_TABLET | Freq: Every day | ORAL | Status: DC

## 2011-05-07 MED ORDER — INSULIN GLARGINE 100 UNIT/ML ~~LOC~~ SOLN: SUBCUTANEOUS | Status: DC

## 2011-05-07 MED ORDER — SITAGLIPTIN PHOSPHATE 100 MG PO TABS: 50.0000 mg | ORAL_TABLET | Freq: Every day | ORAL | Status: DC

## 2011-05-07 NOTE — Patient Instructions (Signed)
Rec   see dermatology about the arm area.  Your diabetes is in good control. Call if leg pain progressing.   Wellness visit in September or thereabouts.

## 2011-05-08 ENCOUNTER — Encounter: Payer: Self-pay | Admitting: Internal Medicine

## 2011-05-08 ENCOUNTER — Telehealth: Payer: Self-pay | Admitting: *Deleted

## 2011-05-08 DIAGNOSIS — D485 Neoplasm of uncertain behavior of skin: Secondary | ICD-10-CM | POA: Insufficient documentation

## 2011-05-08 NOTE — Progress Notes (Signed)
  Subjective:    Patient ID: Cassandra Evans, female    DOB: 1933-01-26, 77 y.o.   MRN: [id number]  HPI Patient comes in today for followup of multiple medical issues. Since her last visit she has had no major changes in her health status. She actually feels pretty well. Is going to travel to [place] next month for a wedding. She is transitioned well to her condominium. DM: No low-dose mostly 90-110 fasting she feels that her blood sugars are in excellent control. No signs or symptoms unusual infections. HT: Controlled doing well LEG: The leg discomfort she discussed last time it's still there at times but getting better it's intermittent it tends to occur in the evening at night without radiation or significant dysfunction. She is able to do her classes and no limitations of activity. She thinks it is getting better. SKIN: There is an area of scaly redness on her left arm that she states has really been there since a basal cell carcinoma was removed a few years ago. Possibly getting worse unsure if it should be rechecked.    Review of Systems Negative for chest pain shortness of breath major changes in her vision or hearing bleeding new joint pains swelling. Rest as per history of present illness    Objective:   Physical Exam Well-developed well-nourished in no acute distress HEENT: Grossly normal she wears glasses Neck supple without masses thyromegaly or bruit Chest:  Clear to A&P without wheezes rales or rhonchi CV:  S1-S2 no gallops or murmurs peripheral perfusion is normal Abdomen:  Sof,t normal bowel sounds without hepatosplenomegaly, no guarding rebound or masses no CVA tenderness No clubbing cyanosis or edema MS normal gait good range of motion of the hip no focal tenderness on her back or his leg. Skin: Left  proximal arm with about 1 cm of irregular redness and scaling.    Labs reviewed hg a1c 6.9    Assessment & Plan:  Diabetes Mellitus  Doing well in excellent  control Hypertension controlled Renal insufficiency stable Right leg discomfort; this is very vague intermittent but at night   because it is getting some better we will continue to observe carefully consider further evaluation is progressive persistence. Lesion left arm  at site of previous cancer. This does not look like a basal cell but I think needs to be evaluated by a dermatologist.   She will look into this and calls it she needs a referral.   Otherwise wellness check with a full set of labs in about 4 months.

## 2011-05-08 NOTE — Telephone Encounter (Signed)
Open in error

## 2011-05-10 ENCOUNTER — Telehealth: Payer: Self-pay

## 2011-05-10 NOTE — Telephone Encounter (Signed)
Received a message from [facility] regarding pt's Juanuva.  Clarification is needed on splitting of the medication.  Reference number [id number]

## 2011-05-14 NOTE — Telephone Encounter (Signed)
Spoke to pharmacy and they changed rx to 50mg  because you can't cut the 100mg  in half.

## 2011-07-15 ENCOUNTER — Telehealth: Payer: Self-pay | Admitting: *Deleted

## 2011-07-15 NOTE — Telephone Encounter (Signed)
error 

## 2011-07-17 ENCOUNTER — Ambulatory Visit (INDEPENDENT_AMBULATORY_CARE_PROVIDER_SITE_OTHER): Payer: Medicare Other | Admitting: Internal Medicine

## 2011-07-17 ENCOUNTER — Encounter: Payer: Self-pay | Admitting: Internal Medicine

## 2011-07-17 DIAGNOSIS — Z8551 Personal history of malignant neoplasm of bladder: Secondary | ICD-10-CM

## 2011-07-17 DIAGNOSIS — H6120 Impacted cerumen, unspecified ear: Secondary | ICD-10-CM

## 2011-07-17 NOTE — Progress Notes (Signed)
  Subjective:    Patient ID: Cassandra Evans, female    DOB: 04-08-1933, 77 y.o.   MRN: [id number]  HPI  Patient comes in directed by her cardiologist because of wax in the tip of her hearing aid embedded in her left ear canal. She had no symptoms with this. No fever . Will be getting bilateral hearing aids   Review of Systems     Objective:   Physical Exam Right eac and tm is normal .    Left eac patent no deformity  After irrigation EAC is clear TM intact no pain Neck no adenopathy       Assessment & Plan:  Wax in EAC/ hearing aid Discussion. No further rx  Hx of bladder cancer  On actos  Patient request for information.  Request record paper from [facility] and to review her therapy with Actos.

## 2011-07-19 ENCOUNTER — Encounter: Payer: Self-pay | Admitting: Internal Medicine

## 2011-07-19 ENCOUNTER — Ambulatory Visit (INDEPENDENT_AMBULATORY_CARE_PROVIDER_SITE_OTHER): Payer: Medicare Other | Admitting: Internal Medicine

## 2011-07-19 VITALS — BP 120/80 | HR 78 | Wt 148.0 lb

## 2011-07-19 DIAGNOSIS — H60399 Other infective otitis externa, unspecified ear: Secondary | ICD-10-CM

## 2011-07-19 DIAGNOSIS — H6090 Unspecified otitis externa, unspecified ear: Secondary | ICD-10-CM | POA: Insufficient documentation

## 2011-07-19 DIAGNOSIS — H9209 Otalgia, unspecified ear: Secondary | ICD-10-CM | POA: Insufficient documentation

## 2011-07-19 DIAGNOSIS — E119 Type 2 diabetes mellitus without complications: Secondary | ICD-10-CM

## 2011-07-19 MED ORDER — OFLOXACIN 0.3 % OT SOLN: 10.0000 [drp] | Freq: Every day | OTIC | Status: AC

## 2011-07-19 NOTE — Patient Instructions (Signed)
This seems to be a mild case of  External otitis prob from the irrigation and hearing aid.  Use antibiotic drops once a day for 7 days . Call if fever worsening or other concerns.  Use hearing aid when better .

## 2011-07-19 NOTE — Progress Notes (Signed)
  Subjective:    Patient ID: Cassandra Evans, female    DOB: [birth year], 77 y.o.   MRN: [id number]  HPI Patient comes in because she is having left ear discomfort for a day or 2 on the left when she lays on it in bed. She had an irrigation couple days ago. No pain at that time. She's had no fever had some slight itching and no discharge. Because it is Friday before weekend she thought she should come in and get checked. She did use her hearing aid immediately after the irrigation. Wonders if that was a problem.   Review of Systems No fever change in hearing cold symptoms. Past history family history social history reviewed in the electronic medical record.     Objective:   Physical Exam Well-developed well-nourished in no acute distress left ear  Mildly red at tragus and midly tender  Pinna pull neg  eac mild erythema no edema or discharge .  Tm ? retracted pars flaccida tm slightly thickened and white but nl lm no per no redness. Neck no masses    Assessment & Plan:  Left ear discomfort prob mild EO after irrigation and hearing aid floxin otic drops. Qd for 5-7 days fu if  persistent or progressive

## 2011-07-24 ENCOUNTER — Encounter: Payer: Self-pay | Admitting: Internal Medicine

## 2011-08-01 IMAGING — US US RENAL
1 series · 14 of 25 positions shown · non-contrast
Comparison: CT abdomen pelvis of 02/08/2005

CLINICAL DATA: Renal insufficiency, prior cystectomy

RENAL/URINARY TRACT ULTRASOUND COMPLETE

[Series 1: us renal · 0.31mm/px · 14 of 33 slices shown]
[im 1/33]
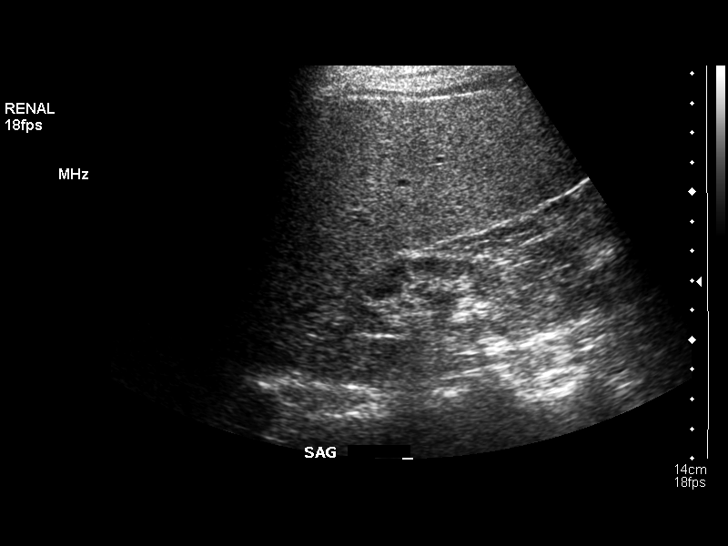
[im 3/33]
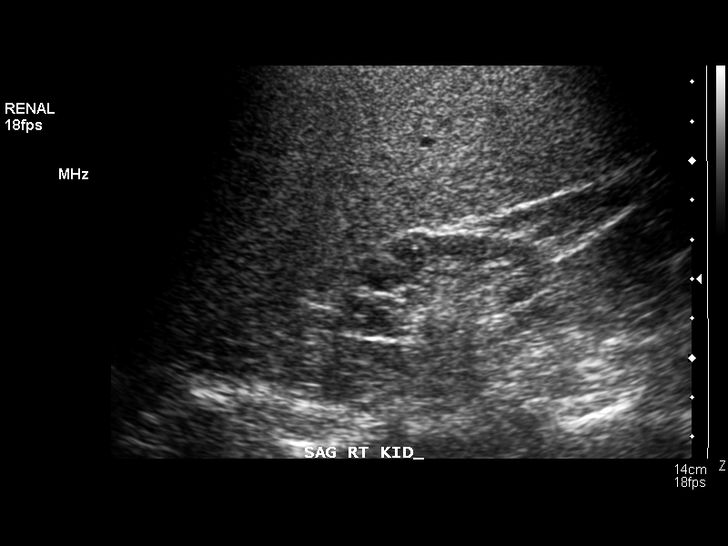
[im 6/33]
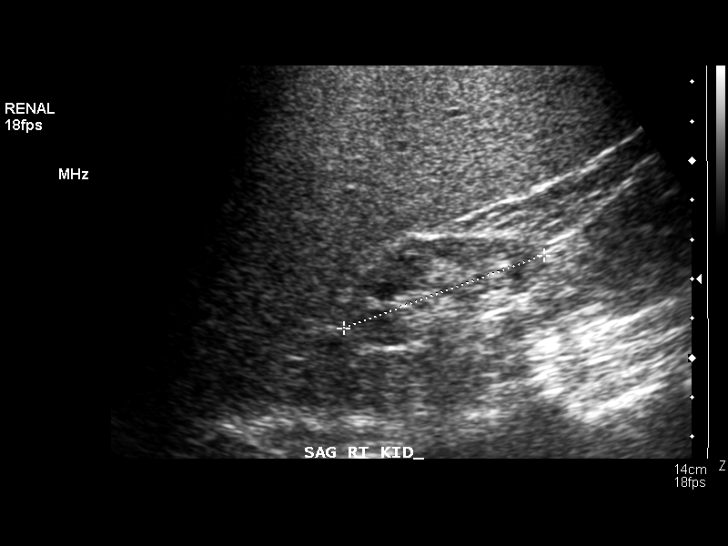
[im 9/33]
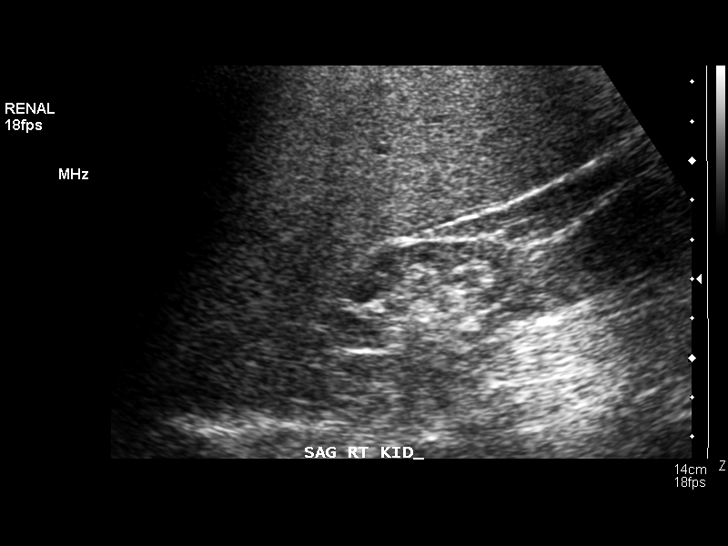
[im 11/33]
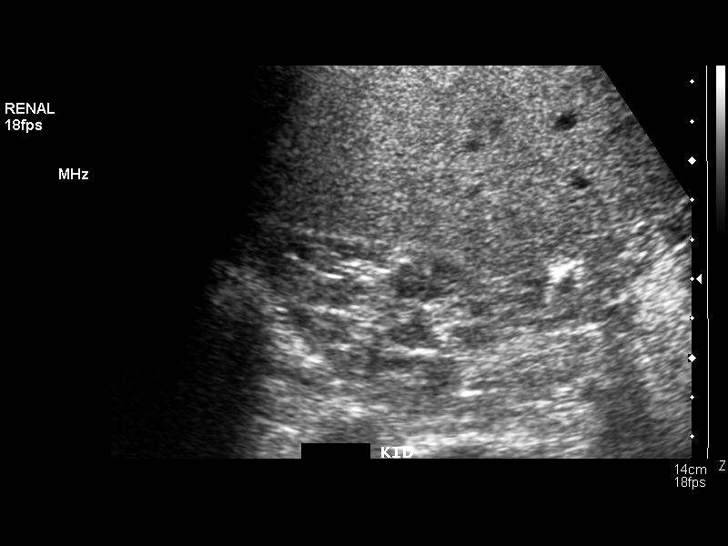
[im 13/33]
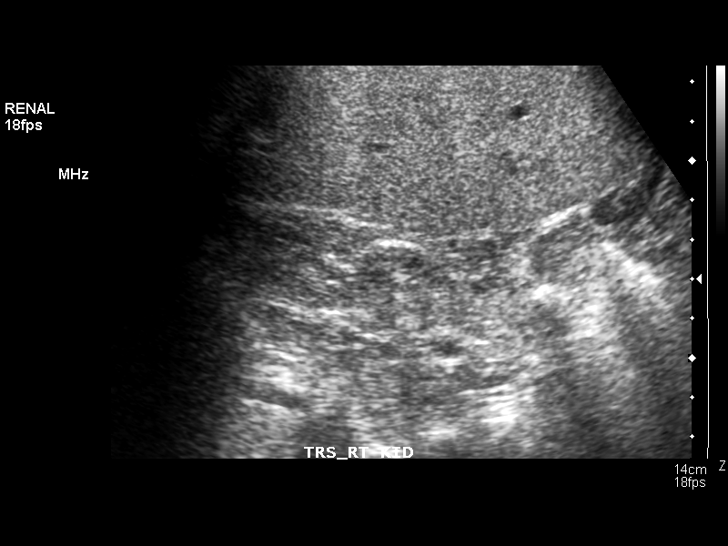
[im 15/33]
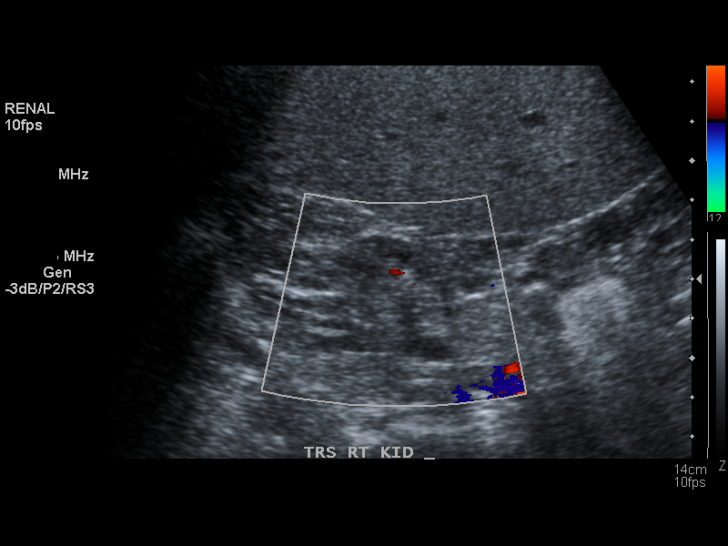
[im 18/33]
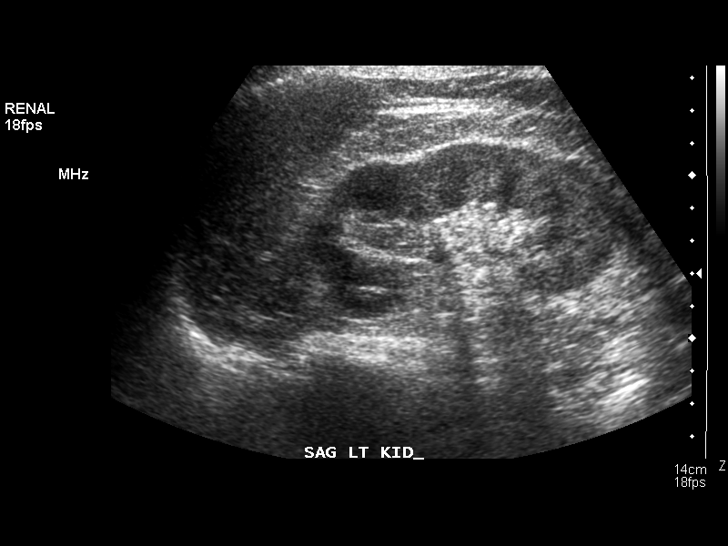
[im 21/33]
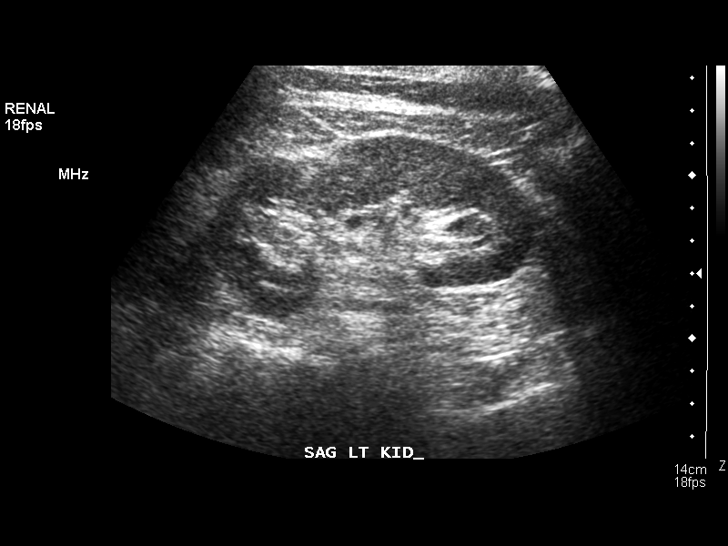
[im 22/33]
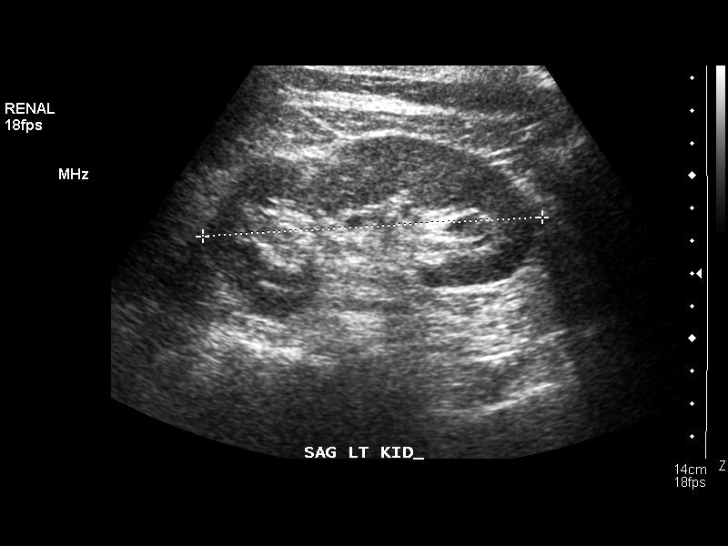
[im 25/33]
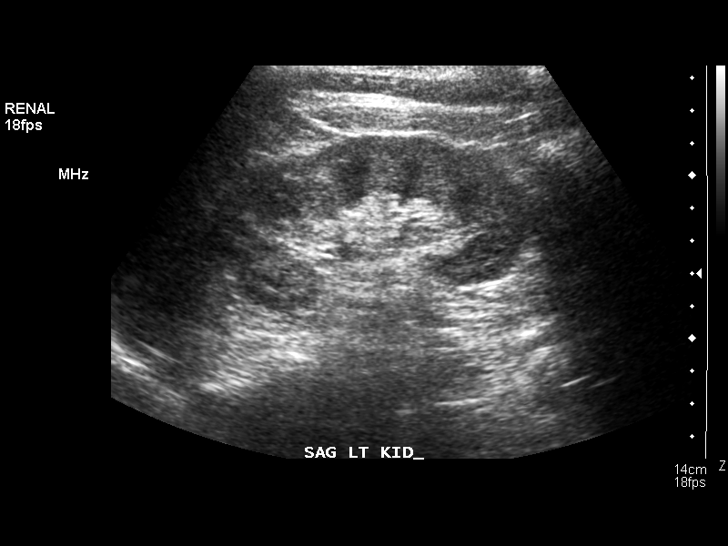
[im 27/33]
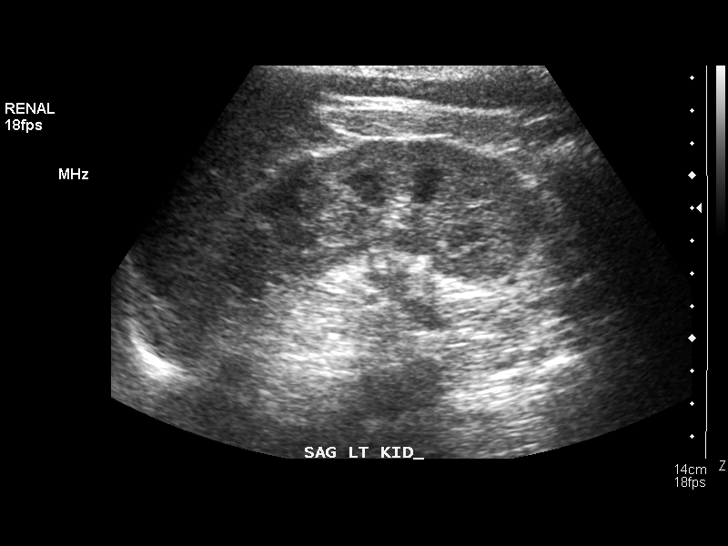
[im 30/33]
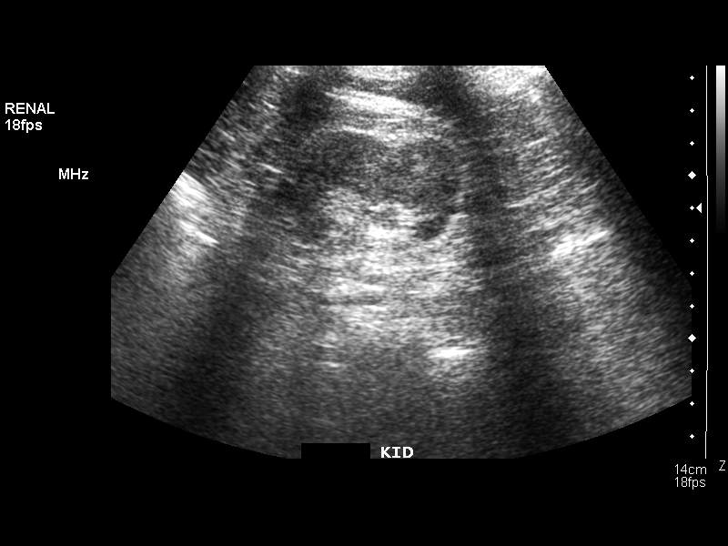
[im 33/33]
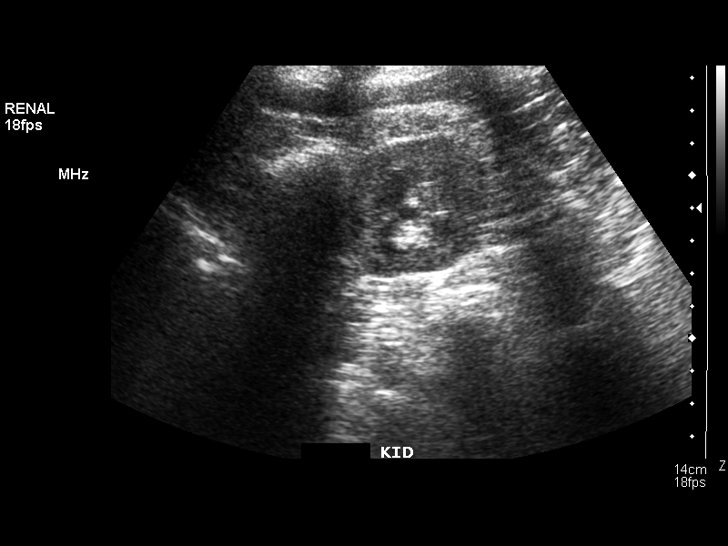

[14 of 25 positions shown; findings below may reference images not displayed]

FINDINGS: Right Kidney:  No hydronephrosis is seen.  The right kidney does
appear to be small and echogenic consistent with atrophy, measuring
5.4 cm sagittally.

Left Kidney:  No hydronephrosis.  The left kidney is normal in size
measuring 10.4 cm sagittally.

Bladder:  The urinary bladder has previously been removed.
IMPRESSION: Atrophic right kidney.  No hydronephrosis.

## 2011-08-27 ENCOUNTER — Other Ambulatory Visit: Payer: Self-pay | Admitting: *Deleted

## 2011-08-27 MED ORDER — GLIPIZIDE 5 MG PO TABS: 5.0000 mg | ORAL_TABLET | Freq: Every day | ORAL | Status: DC

## 2011-08-28 ENCOUNTER — Telehealth: Payer: Self-pay | Admitting: *Deleted

## 2011-08-28 NOTE — Telephone Encounter (Signed)
error 

## 2011-09-06 ENCOUNTER — Encounter: Payer: Self-pay | Admitting: Internal Medicine

## 2011-09-06 ENCOUNTER — Ambulatory Visit (INDEPENDENT_AMBULATORY_CARE_PROVIDER_SITE_OTHER): Payer: Medicare Other | Admitting: Internal Medicine

## 2011-09-06 DIAGNOSIS — Z9889 Other specified postprocedural states: Secondary | ICD-10-CM

## 2011-09-06 DIAGNOSIS — Z8551 Personal history of malignant neoplasm of bladder: Secondary | ICD-10-CM

## 2011-09-06 DIAGNOSIS — N259 Disorder resulting from impaired renal tubular function, unspecified: Secondary | ICD-10-CM

## 2011-09-06 DIAGNOSIS — Z23 Encounter for immunization: Secondary | ICD-10-CM

## 2011-09-06 DIAGNOSIS — D649 Anemia, unspecified: Secondary | ICD-10-CM

## 2011-09-06 DIAGNOSIS — Z974 Presence of external hearing-aid: Secondary | ICD-10-CM

## 2011-09-06 DIAGNOSIS — I10 Essential (primary) hypertension: Secondary | ICD-10-CM

## 2011-09-06 DIAGNOSIS — E785 Hyperlipidemia, unspecified: Secondary | ICD-10-CM

## 2011-09-06 DIAGNOSIS — Z136 Encounter for screening for cardiovascular disorders: Secondary | ICD-10-CM

## 2011-09-06 DIAGNOSIS — E119 Type 2 diabetes mellitus without complications: Secondary | ICD-10-CM

## 2011-09-06 LAB — CBC WITH DIFFERENTIAL/PLATELET
Basophils Relative: 0.3 % (ref 0.0–3.0)
Eosinophils Relative: 1.4 % (ref 0.0–5.0)
Hemoglobin: 11.6 g/dL — ABNORMAL LOW (ref 12.0–15.0)
Lymphocytes Relative: 24.5 % (ref 12.0–46.0)
Monocytes Relative: 11.9 % (ref 3.0–12.0)
Neutrophils Relative %: 61.9 % (ref 43.0–77.0)
RBC: 3.77 Mil/uL — ABNORMAL LOW (ref 3.87–5.11)
WBC: 4.1 10*3/uL — ABNORMAL LOW (ref 4.5–10.5)

## 2011-09-06 LAB — HEPATIC FUNCTION PANEL
ALT: 23 U/L (ref 0–35)
AST: 26 U/L (ref 0–37)
Bilirubin, Direct: 0.1 mg/dL (ref 0.0–0.3)
Total Bilirubin: 0.5 mg/dL (ref 0.3–1.2)
Total Protein: 7.1 g/dL (ref 6.0–8.3)

## 2011-09-06 LAB — BASIC METABOLIC PANEL
BUN: 48 mg/dL — ABNORMAL HIGH (ref 6–23)
CO2: 25 mEq/L (ref 19–32)
Chloride: 108 mEq/L (ref 96–112)
Potassium: 4.6 mEq/L (ref 3.5–5.1)

## 2011-09-06 LAB — MICROALBUMIN / CREATININE URINE RATIO
Creatinine,U: 78 mg/dL
Microalb Creat Ratio: 3.8 mg/g (ref 0.0–30.0)
Microalb, Ur: 3 mg/dL — ABNORMAL HIGH (ref 0.0–1.9)

## 2011-09-06 LAB — LIPID PANEL
LDL Cholesterol: 76 mg/dL (ref 0–99)
VLDL: 12.8 mg/dL (ref 0.0–40.0)

## 2011-09-06 MED ORDER — GLIPIZIDE 5 MG PO TABS: 5.0000 mg | ORAL_TABLET | Freq: Every day | ORAL | Status: DC

## 2011-09-06 NOTE — Patient Instructions (Signed)
Continue lifestyle intervention healthy eating and exercise . Will notify you  of labs when available.  Fu depending on labs or 4 months with labs

## 2011-09-06 NOTE — Progress Notes (Signed)
Subjective:    Cassandra Evans is a 77 y.o. female who presents for   Annual Medicare Wellness Visit. And fu of Patient comes in today for follow up of  multiple medical problems.   Since last visit has done well: NEW problem: left eye drainage and pain treated at  Urgent care  Antibiotic drop and  Eye doc  Said to follow up if needed.  DM no lows readings in range below 150  And lower. No numbness and no unusual infections  LIPIDS: no side effects of meds  Now has hearing aids and doing well. hypertension   No se of meds  utd on eye exam mammo gram  No injury or med change   Cardiac risk factors: advanced age (older than 34 for men, 36 for women), diabetes mellitus, dyslipidemia and hypertension.  Activities of Daily Living  In your present state of health, do you have any difficulty performing the following activities?:  Preparing food and eating?: No Bathing yourself: No Getting dressed: No Using the toilet:No Moving around from place to place: No In the past year have you fallen or had a near fall?:No  Current exercise habits: Home exercise routine includes walking 0.5 hrs per day.   Dietary issues discussed: eating healthy   Depression Screen (Note: if answer to either of the following is "Yes", then aore complete depression screening is indicated)  Q1: Over the past two weeks, have you felt down, depressed or hopeless?no Q2: Over the past two weeks, have you felt little interest or pleasure in doing things? no    Hearing: hearing aids doing well  Vision:  No limitations at present . Vision see above sees Dr Hazle Quant.  Safety:  Has smoke detector and wears seat belts.  No firearms. No excess sun exposure. Sees dentist regularly.  Falls: no doing adaptive Jazzercise  Advance directive :  Reviewed  Has one.  Memory: Felt to be good  , no concern from her or her family.  Depression: No anhedonia unusual crying or depressive symptoms  Nutrition: Eats well  balanced diet; adequate calcium and vitamin D. No swallowing chewiing problems.  Injury: no major injuries in the last six months.  Other healthcare providers:  Reviewed today .  Social:  Widowed travels with family .   Preventive parameters: up-to-date on colonoscopy, mammogram, immunizations. Including Tdap and pneumovax.  ADLS:   There are no problems or need for assistance  driving, feeding, obtaining food, dressing, toileting and bathing, managing money using phone. She is independent.    Review of Systems ROS:  GEN/ HEENTNo fever, significant weight changes sweats headaches vision problems hearing changes, CV/ PULM; No chest pain shortness of breath cough, syncope,edema  change in exercise tolerance. GI /GU: No adominal pain, vomiting, change in bowel habits. No blood in the stool. SKIN/HEME: ,no acute skin rashes suspicious lesions or bleeding. No lymphadenopathy, nodules, masses.  NEURO/ PSYCH:  No neurologic signs such as weakness numbness No depression anxiety. IMM/ Allergy: No unusual infections.  Allergy .    See hpi  REST of 12 system review negative  Past history family history social history reviewed in the electronic medical record.    Objective:     Vision done May 2012- Dr. Hazle Quant- Normal Blood pressure 120/60, pulse 78, height 5' 0.5" (1.537 m), weight 148 lb (67.132 kg). Body mass index is 28.43 kg/(m^2).   Physical Exam: Vital signs reviewed ZOX:WRUE is a well-developed well-nourished alert cooperative  white female who appears her stated  age in no acute distress.  HEENT: normocephalic  traumatic , Eyes: PERRL EOM's full, conjunctiva clear,  Slight teearing bilaterally no  Crusting Nares: paten,t no deformity discharge or tenderness., Ears: no deformity EAC'  Hearing aids . Mouth: clear OP, no lesions, edema.  Moist mucous membranes. Dentition in adequate repair. NECK: supple without masses, thyromegaly or bruits. CHEST/PULM:  Clear to auscultation and  percussion breath sounds equal no wheeze , rales or rhonchi. No chest wall deformities or tenderness. CV: PMI is nondisplaced, S1 S2 no gallops, murmurs, rubs. Peripheral pulses are full without delay.No JVD .  ABDOMEN: Bowel sounds normal nontender  No guard or rebound, no hepato splenomegal no CVA tenderness.  No hernia.   Urine Bag n place RLQ  Extremtities:  No clubbing cyanosis or edema, no acute joint swelling or redness no focal atrophy slight edema vv  No ulcers or calluses.   NEURO:  Oriented x3, cranial nerves 3-12 appear to be intact, no obvious focal weakness,gait within normal limits no abnormal reflexes or asymmetrical SKIN: No acute rashes normal turgor, color, no bruising or petechiae. PSYCH: Oriented, good eye contact, no obvious depression anxiety, cognition and judgment appear normal. Diabetic foot  Nl 10 gauge probe and no ulcers or  Lesions.  NV intact  Gait excellent  Balance    EKG NSR no acute changes Assessment:   Wellness   utd on HCParameters Counseled regarding healthy nutrition, exercise, sleep, injury prevention, calcium vit d and healthy weight . Disc fall prevention and fall prevention. Hypertension   controlled Diabetes Mellitus controlled  Dyslipidemia no change in meds  Hx of renal cancer ;   EYe s/p rx for conjuctivitis     Plan:     During the course of the visit the patient was educated and counseled about appropriate screening and preventive services  Labs today for diease management and screening Patient Instructions (the written plan) was given to the patient.  Medicare Attestation I have personally reviewed: The patient's medical and social history Their use of alcohol, tobacco or illicit drugs Their current medications and supplements The patient's functional ability including ADLs,fall risks, home safety risks, cognitive, and hearing and visual impairment Diet and physical activities Evidence for depression or mood disorders  The  patient's weight, height, BMI, and visual acuity have been recorded in the chart.  I have made referrals, counseling, and provided education to the patient based on review of the above and I have provided the patient with a written personalized care plan for preventive services.

## 2011-09-09 ENCOUNTER — Other Ambulatory Visit: Payer: Self-pay | Admitting: *Deleted

## 2011-09-09 ENCOUNTER — Telehealth: Payer: Self-pay | Admitting: *Deleted

## 2011-09-09 NOTE — Telephone Encounter (Signed)
Updated medication list

## 2011-09-10 ENCOUNTER — Telehealth: Payer: Self-pay | Admitting: *Deleted

## 2011-09-10 DIAGNOSIS — D649 Anemia, unspecified: Secondary | ICD-10-CM

## 2011-09-10 NOTE — Telephone Encounter (Signed)
Message copied by Romualdo Bolk on Tue Sep 10, 2011  3:30 PM ------      Message from: [facility], [place] K      Created: Fri Sep 06, 2011  8:45 PM       Tell patient  Labs good a1c is 6.9  Renal function stable.      Slightly anemic like before.          No change in meds.      Add cbc diff to the a1c labs next visit ( Dx anemia)

## 2011-09-10 NOTE — Telephone Encounter (Signed)
Pt aware of results 

## 2011-11-16 ENCOUNTER — Other Ambulatory Visit: Payer: Self-pay | Admitting: Internal Medicine

## 2012-01-06 ENCOUNTER — Other Ambulatory Visit (INDEPENDENT_AMBULATORY_CARE_PROVIDER_SITE_OTHER): Payer: Medicare Other

## 2012-01-06 DIAGNOSIS — E119 Type 2 diabetes mellitus without complications: Secondary | ICD-10-CM

## 2012-01-06 LAB — HEMOGLOBIN A1C: Hgb A1c MFr Bld: 7.4 % — ABNORMAL HIGH (ref 4.6–6.5)

## 2012-01-16 NOTE — Progress Notes (Signed)
Quick Note:  Left message to call back. ______ 

## 2012-01-17 NOTE — Progress Notes (Signed)
Quick Note:  Pt aware of the results. Pt is doing okay. ______

## 2012-01-21 ENCOUNTER — Telehealth: Payer: Self-pay | Admitting: Internal Medicine

## 2012-01-21 DIAGNOSIS — E119 Type 2 diabetes mellitus without complications: Secondary | ICD-10-CM

## 2012-01-21 DIAGNOSIS — I10 Essential (primary) hypertension: Secondary | ICD-10-CM

## 2012-01-21 NOTE — Telephone Encounter (Signed)
Late April or MAy  Hg a1c  And   BMP and then OV .

## 2012-01-21 NOTE — Telephone Encounter (Signed)
Pt had last a1c in jan 2013. Pt would like to know when should she have next a1c

## 2012-01-22 NOTE — Telephone Encounter (Signed)
Orders placed in epic

## 2012-01-23 NOTE — Telephone Encounter (Signed)
Pt called back and has been sch for ordered labs on 04/20/12 and fup on 04/27/12 as noted. Pt req these appt dates.

## 2012-01-23 NOTE — Telephone Encounter (Signed)
lmom for pt to call back

## 2012-01-27 ENCOUNTER — Other Ambulatory Visit: Payer: Self-pay | Admitting: Internal Medicine

## 2012-04-20 ENCOUNTER — Other Ambulatory Visit (INDEPENDENT_AMBULATORY_CARE_PROVIDER_SITE_OTHER): Payer: Medicare Other

## 2012-04-20 DIAGNOSIS — E119 Type 2 diabetes mellitus without complications: Secondary | ICD-10-CM

## 2012-04-20 LAB — HEMOGLOBIN A1C: Hgb A1c MFr Bld: 7 % — ABNORMAL HIGH (ref 4.6–6.5)

## 2012-04-20 LAB — BASIC METABOLIC PANEL
BUN: 40 mg/dL — ABNORMAL HIGH (ref 6–23)
Chloride: 107 mEq/L (ref 96–112)
Creatinine, Ser: 1.4 mg/dL — ABNORMAL HIGH (ref 0.4–1.2)
Glucose, Bld: 142 mg/dL — ABNORMAL HIGH (ref 70–99)

## 2012-04-27 ENCOUNTER — Encounter: Payer: Self-pay | Admitting: Internal Medicine

## 2012-04-27 ENCOUNTER — Ambulatory Visit (INDEPENDENT_AMBULATORY_CARE_PROVIDER_SITE_OTHER): Payer: Medicare Other | Admitting: Internal Medicine

## 2012-04-27 VITALS — BP 160/88 | HR 88 | Temp 97.4°F | Wt 152.0 lb

## 2012-04-27 DIAGNOSIS — I10 Essential (primary) hypertension: Secondary | ICD-10-CM

## 2012-04-27 DIAGNOSIS — Z8551 Personal history of malignant neoplasm of bladder: Secondary | ICD-10-CM

## 2012-04-27 DIAGNOSIS — N259 Disorder resulting from impaired renal tubular function, unspecified: Secondary | ICD-10-CM

## 2012-04-27 DIAGNOSIS — E119 Type 2 diabetes mellitus without complications: Secondary | ICD-10-CM

## 2012-04-27 DIAGNOSIS — E785 Hyperlipidemia, unspecified: Secondary | ICD-10-CM

## 2012-04-27 NOTE — Patient Instructions (Addendum)
Check your blood pressure  Readings about 3 x per week.  Call if continuing elevated .  We need to make sure your bp is at goal  To protect ypour kidneys. We want below 140/90  Or less . Bring in  Readings  Of   BP  And  machine to your next visit.  ROV in 3 months or as needed.

## 2012-04-27 NOTE — Progress Notes (Signed)
Subjective:    Patient ID: Cassandra Evans, female    DOB: 1933/03/15, 78 y.o.   MRN: [id number]  HPI Patient comes in today for follow up of  multiple medical problems.  No major changes ; ,injury surgery or hospitalizations. She has seen dr Allena Katz renal and evaluation Dm  Usually 6-8   untis or so. And bg at goal but wishes she could lose more weight .  Eating well. And reading in the 90 or so.  nosig lows BP has reported ok  At home but some up at renal office  WC Effct   renal fucntion stable No numbness change in vision or infections To travel soon family  Review of Systems No cp sob. Bleeding uti new pains   Past history family history social history reviewed in the electronic medical record. Outpatient Encounter Prescriptions as of 04/27/2012  Medication Sig Dispense Refill  . aspirin 81 MG chewable tablet Chew 81 mg by mouth daily.        . B-D ULTRAFINE III SHORT PEN 31G X 8 MM MISC USE 1 DAILY  100 each  2  . DIOVAN 160 MG tablet TAKE 1 TABLET DAILY  90 tablet  2  . glipiZIDE (GLUCOTROL XL) 5 MG 24 hr tablet Take 5 mg by mouth daily.        Marland Kitchen glucose blood (FREESTYLE TEST STRIPS) test strip 1 each by Other route 4 (four) times daily. Use as instructed       . insulin glargine (LANTUS SOLOSTAR) 100 UNIT/ML injection Use 6-8 units at bedtime  15 mL  3  . Lancets (FREESTYLE) lancets 1 each by Other route 4 (four) times daily. Use as instructed       . MULTIPLE VITAMIN PO Take by mouth.        Marland Kitchen olopatadine (PATANOL) 0.1 % ophthalmic solution Place 1 drop into both eyes 2 (two) times daily.        . pioglitazone (ACTOS) 30 MG tablet TAKE 1 TABLET DAILY  90 tablet  2  . simvastatin (ZOCOR) 40 MG tablet Take 1 tablet (40 mg total) by mouth at bedtime.  90 tablet  3  . sitaGLIPtan (JANUVIA) 100 MG tablet Take 0.5 tablets (50 mg total) by mouth daily.  45 tablet  3  . VITAMIN D, ERGOCALCIFEROL, PO Take by mouth.             Objective:   Physical Exam BP 160/88  Pulse 88   Temp(Src) 97.4 F (36.3 C) (Oral)  Wt 152 lb (68.947 kg) 148/60 right  148/70 left  WDWN in nad Neck: Supple without adenopathy or masses or bruits Chest:  Clear to A&P without wheezes rales or rhonchi CV:  S1-S2 no gallops or murmurs peripheral perfusion is normal Abdomen:  Sof,t normal bowel sounds without hepatosplenomegaly, no guarding rebound or masses no CVA tenderness No clubbing cyanosis or edema Reviewed note from Dr Allena Katz with labs  Lab Results  Component Value Date   WBC 4.1* 09/06/2011   HGB 11.6* 09/06/2011   HCT 35.0* 09/06/2011   PLT 172.0 09/06/2011   GLUCOSE 142* 04/20/2012   CHOL 192 09/06/2011   TRIG 64.0 09/06/2011   HDL 103.60 09/06/2011   LDLDIRECT 97.1 09/19/2009   LDLCALC 76 09/06/2011   ALT 23 09/06/2011   AST 26 09/06/2011   NA 141 04/20/2012   K 4.9 04/20/2012   CL 107 04/20/2012   CREATININE 1.4* 04/20/2012   BUN 40* 04/20/2012   CO2 23  04/20/2012   TSH 0.85 09/06/2011   HGBA1C 7.0* 04/20/2012   MICROALBUR 3.0* 09/06/2011       Assessment & Plan:  DM adequate control   Continue lifestyle intervention healthy eating and exercise . And meds   No sig se at present. HT  Get readings at home  To ensure control for renal protection.  Counseled. Bring in machine to next visit. Monitor. Renal insuff stage 3  Follow unilateral kidney LIPIDS no se of meds

## 2012-05-13 ENCOUNTER — Ambulatory Visit (INDEPENDENT_AMBULATORY_CARE_PROVIDER_SITE_OTHER)
Admission: RE | Admit: 2012-05-13 | Discharge: 2012-05-13 | Disposition: A | Discharge status: Home / Self Care | Payer: Medicare Other | Source: Ambulatory Visit | Attending: Internal Medicine | Admitting: Internal Medicine

## 2012-05-13 ENCOUNTER — Telehealth: Payer: Self-pay | Admitting: Internal Medicine

## 2012-05-13 ENCOUNTER — Telehealth: Payer: Self-pay

## 2012-05-13 ENCOUNTER — Encounter: Payer: Self-pay | Admitting: Internal Medicine

## 2012-05-13 ENCOUNTER — Ambulatory Visit (INDEPENDENT_AMBULATORY_CARE_PROVIDER_SITE_OTHER): Payer: Medicare Other | Admitting: Internal Medicine

## 2012-05-13 VITALS — BP 140/86 | HR 98 | Temp 98.3°F | Wt 148.0 lb

## 2012-05-13 DIAGNOSIS — N259 Disorder resulting from impaired renal tubular function, unspecified: Secondary | ICD-10-CM

## 2012-05-13 DIAGNOSIS — J22 Unspecified acute lower respiratory infection: Secondary | ICD-10-CM

## 2012-05-13 DIAGNOSIS — R05 Cough: Secondary | ICD-10-CM

## 2012-05-13 DIAGNOSIS — J988 Other specified respiratory disorders: Secondary | ICD-10-CM

## 2012-05-13 DIAGNOSIS — E119 Type 2 diabetes mellitus without complications: Secondary | ICD-10-CM

## 2012-05-13 DIAGNOSIS — Z8551 Personal history of malignant neoplasm of bladder: Secondary | ICD-10-CM

## 2012-05-13 DIAGNOSIS — R0789 Other chest pain: Secondary | ICD-10-CM

## 2012-05-13 MED ORDER — HYDROCODONE-HOMATROPINE 5-1.5 MG/5ML PO SYRP: 5.0000 mL | ORAL_SOLUTION | ORAL | Status: AC | PRN

## 2012-05-13 MED ORDER — AZITHROMYCIN 250 MG PO TABS: ORAL_TABLET | ORAL | Status: AC

## 2012-05-13 NOTE — Progress Notes (Signed)
Quick Note:  Called and spoke with pt about lab results. Rx sent to pharmacy. ______

## 2012-05-13 NOTE — Telephone Encounter (Signed)
Rx sent to pharmacy. Pt is aware.  

## 2012-05-13 NOTE — Progress Notes (Signed)
  Subjective:    Patient ID: Cassandra Evans, female    DOB: 1933-05-10, 78 y.o.   MRN: [id number]  HPI  Patient comes in today for SDA for  new problem evaluation. Just got back from [place] last night    Had snow there.  Onset  Of sx  About 4 days ago and had to travel inernationally.  Chest is tight and coughing a lot and hurts to cough and through to back.  Cough yellow" bad color. " no blood seen  No fever chills but feels cold.  A lot feverish.has post nasal drainage and ear feels clogged.   Unsure what to take  Feels bad  No one else got sick  Review of Systems No cp other than above leg swelling change in diabetes  Face pain  Today  NVD  Past history family history social history reviewed in the electronic medical record.    see med list  Objective:   Physical Exam BP 140/86  Pulse 98  Temp(Src) 98.3 F (36.8 C) (Oral)  Wt 148 lb (67.132 kg)  SpO2 96% WDWN looks ill nonn toxic hoarse and wheezy deep chest cough  HEENT: Normocephalic ;atraumatic , Eyes;  PERRL, EOMs  Full, lids and conjunctiva clear,,Ears: no deformities, canals nl, TM landmarks clear fluid leve > bilaterally no redness  Nose: no deformity or discharge but  Very congested;face minimally tender Mouth : OP clear without lesion or edema . Neck: Supple without adenopathy or masses or bruits Chest:   Rare tubular sound bronchial sound on right no wheeze or rales CV:  S1-S2 no gallops or murmurs peripheral perfusion is normal Skin :nl perfusion and no acute rashes  Abdomen:  Sof,t normal bowel sounds without hepatosplenomegaly, no guarding rebound or masses no CVA tenderness No clubbing cyanosis or edema    Assessment & Plan:  Acute resp illness infectious   [place] travel. Concern about complications with underlying hx and severity. Poss bacterial bronchitis   R/o pna  Get c xray today and plan rx based on results. X ray no pna cw bronchitis  rx with  azithro  No CI to such  And f ollow   Expectant management. And contact us if alarm features .  Dm renal insuff hx of bladder cancer

## 2012-05-13 NOTE — Telephone Encounter (Signed)
Patient called stating that she is having cough, congestion, back and side pain and wishes to see Dr. Fabian Sharp only. Please advise.

## 2012-05-13 NOTE — Telephone Encounter (Signed)
Pt has an upcoming appt on 05/13/12 at 12:45

## 2012-05-13 NOTE — Patient Instructions (Signed)
Get chest x ray  Today  You may have bacterial bronchitis and depending on x ray results will add antibiotic treatment. Can use cough med for comfort .

## 2012-05-17 DIAGNOSIS — J22 Unspecified acute lower respiratory infection: Secondary | ICD-10-CM | POA: Insufficient documentation

## 2012-05-18 ENCOUNTER — Telehealth: Payer: Self-pay

## 2012-05-18 MED ORDER — ALBUTEROL SULFATE HFA 108 (90 BASE) MCG/ACT IN AERS: 1.0000 | INHALATION_SPRAY | Freq: Four times a day (QID) | RESPIRATORY_TRACT | Status: DC | PRN

## 2012-05-18 NOTE — Telephone Encounter (Signed)
Pt was seen last week and says Dr. Fabian Sharp asked her to call back this week if she was no better and she is not.  She finished the antibx yesterday and is still having lots of coughing and chest feels congestion and SOB.  She did sound winded on the phone.

## 2012-05-18 NOTE — Telephone Encounter (Signed)
Pls advise.  

## 2012-05-18 NOTE — Telephone Encounter (Signed)
Per Dr. Fabian Sharp called pt to get more informaiton.  Pt denies fever.  Pt states she still has a productive cough with pale yellow mucus.  Pt states she does have a little rattling in her chest.  Pt denies ever having an inhaler.    Per Dr. Fabian Sharp advised that an inhaler would be sent to [facility] in [facility] for proventil.  Advised pt to call to office to report how she is feeling and if new symptoms such as fever occur.  Pt states she understands.  Rx sent to pharmacy.

## 2012-06-03 ENCOUNTER — Encounter: Payer: Self-pay | Admitting: Internal Medicine

## 2012-06-17 ENCOUNTER — Other Ambulatory Visit: Payer: Self-pay | Admitting: Internal Medicine

## 2012-07-30 ENCOUNTER — Other Ambulatory Visit (INDEPENDENT_AMBULATORY_CARE_PROVIDER_SITE_OTHER): Payer: Medicare Other

## 2012-07-30 DIAGNOSIS — E119 Type 2 diabetes mellitus without complications: Secondary | ICD-10-CM

## 2012-07-30 LAB — BASIC METABOLIC PANEL
CO2: 25 mEq/L (ref 19–32)
Calcium: 9.5 mg/dL (ref 8.4–10.5)
Chloride: 108 mEq/L (ref 96–112)
Glucose, Bld: 136 mg/dL — ABNORMAL HIGH (ref 70–99)
Sodium: 141 mEq/L (ref 135–145)

## 2012-08-06 ENCOUNTER — Encounter: Payer: Self-pay | Admitting: Internal Medicine

## 2012-08-06 ENCOUNTER — Ambulatory Visit (INDEPENDENT_AMBULATORY_CARE_PROVIDER_SITE_OTHER): Payer: Medicare Other | Admitting: Internal Medicine

## 2012-08-06 VITALS — BP 120/56 | HR 92 | Temp 98.3°F | Wt 146.0 lb

## 2012-08-06 DIAGNOSIS — N259 Disorder resulting from impaired renal tubular function, unspecified: Secondary | ICD-10-CM

## 2012-08-06 DIAGNOSIS — H269 Unspecified cataract: Secondary | ICD-10-CM

## 2012-08-06 DIAGNOSIS — E119 Type 2 diabetes mellitus without complications: Secondary | ICD-10-CM

## 2012-08-06 DIAGNOSIS — I10 Essential (primary) hypertension: Secondary | ICD-10-CM

## 2012-08-06 DIAGNOSIS — E785 Hyperlipidemia, unspecified: Secondary | ICD-10-CM

## 2012-08-06 NOTE — Progress Notes (Signed)
Subjective:    Patient ID: Cassandra Evans, female    DOB: 08-05-1933, 78 y.o.   MRN: [id number]  HPI Patient comes in today for follow up of  multiple medical problems.  No major change in health status since last visit . DM  No lows usually on 8- 10 u of lantus  bg run 100 to 140 or so.  Bp readings about 140 on her home readings  No missed meds  No injury traveled this [place] to [place] and [place] Noted less endurance than in the past she feels from getting older that is disappointing to her but feels no abnormal.  Still doses jazzercise and balance exercises . No falling.  To see Dr Allena Katz soon  Has  Cut down on etoh to less than 1-2 per day as in past and sugars are a bit higher.   Review of Systems No cp sob syncope  Cataract surgery advised but plans to wait until  After her sons wedding in October. Past history family history social history reviewed in the electronic medical record. Outpatient Encounter Prescriptions as of 08/06/2012  Medication Sig Dispense Refill  . aspirin 81 MG chewable tablet Chew 81 mg by mouth daily.        . B-D ULTRAFINE III SHORT PEN 31G X 8 MM MISC USE 1 DAILY  100 each  2  . DIOVAN 160 MG tablet TAKE 1 TABLET DAILY  90 tablet  2  . glipiZIDE (GLUCOTROL XL) 5 MG 24 hr tablet Take 5 mg by mouth daily.        Marland Kitchen glucose blood (FREESTYLE TEST STRIPS) test strip 1 each by Other route 4 (four) times daily. Use as instructed       . insulin glargine (LANTUS SOLOSTAR) 100 UNIT/ML injection Use 6-8 units at bedtime  15 mL  3  . JANUVIA 50 MG tablet TAKE 1 TABLET DAILY  90 tablet  2  . Lancets (FREESTYLE) lancets 1 each by Other route 4 (four) times daily. Use as instructed       . MULTIPLE VITAMIN PO Take by mouth.        Marland Kitchen olopatadine (PATANOL) 0.1 % ophthalmic solution Place 1 drop into both eyes 2 (two) times daily.        . pioglitazone (ACTOS) 30 MG tablet TAKE 1 TABLET DAILY  90 tablet  2  . simvastatin (ZOCOR) 40 MG tablet TAKE 1 TABLET AT BEDTIME  90  tablet  2  . VITAMIN D, ERGOCALCIFEROL, PO Take by mouth.        . DISCONTD: albuterol (PROVENTIL HFA;VENTOLIN HFA) 108 (90 BASE) MCG/ACT inhaler Inhale 1-2 puffs into the lungs every 6 (six) hours as needed for wheezing.  1 Inhaler  0  . DISCONTD: sitaGLIPtan (JANUVIA) 100 MG tablet Take 0.5 tablets (50 mg total) by mouth daily.  45 tablet  3     Chemistry      Component Value Date/Time   NA 141 07/30/2012 0917   K 5.0 07/30/2012 0917   CL 108 07/30/2012 0917   CO2 25 07/30/2012 0917   BUN 36* 07/30/2012 0917   CREATININE 1.4* 07/30/2012 0917      Component Value Date/Time   CALCIUM 9.5 07/30/2012 0917   ALKPHOS 33* 09/06/2011 1135   AST 26 09/06/2011 1135   ALT 23 09/06/2011 1135   BILITOT 0.5 09/06/2011 1135     Lab Results  Component Value Date   HGBA1C 7.1* 07/30/2012  Objective:   Physical Exam  BP 120/56  Pulse 92  Temp 98.3 F (36.8 C) (Oral)  Wt 146 lb (66.225 kg)  SpO2 99%  wdw ni in nad  Appears  Well and younger than stated age.  Gait appears easy Repeat bp right 138/70 right left 142/68  Chest:  Clear to A&P without wheezes rales or rhonchi CV:  S1-S2 no gallops or murmurs peripheral perfusion is normal No clubbing cyanosis or edema Oriented x 3 and no noted deficits in memory, attention, and speech.     Assessment & Plan:  DM  Acceptable levels for age and condition  No sig lows.  Renal insufficiency stable creatinine single kidney  HT  Reading borderline up   Would like to be better however concern about hypotension se based on current readings will continue to monitor and consider intensification of therapy or if Dr Allena Katz feels appropriate  Cataracts  Disc   Removal  May improve her mobility safety .  ROV in 4 months and full labs pre visit .

## 2012-08-06 NOTE — Patient Instructions (Addendum)
Continue medication and monitoring blood pressure as we discussed at this time no change in medicine. We'll send a copy of the labs to Dr. Allena Katz  Followup visit in 4 months laboratory studies before visit.

## 2012-09-29 ENCOUNTER — Other Ambulatory Visit: Payer: Self-pay | Admitting: Internal Medicine

## 2012-11-30 ENCOUNTER — Other Ambulatory Visit (INDEPENDENT_AMBULATORY_CARE_PROVIDER_SITE_OTHER): Payer: Medicare Other

## 2012-11-30 DIAGNOSIS — I10 Essential (primary) hypertension: Secondary | ICD-10-CM

## 2012-11-30 DIAGNOSIS — E785 Hyperlipidemia, unspecified: Secondary | ICD-10-CM

## 2012-11-30 DIAGNOSIS — E119 Type 2 diabetes mellitus without complications: Secondary | ICD-10-CM

## 2012-12-01 LAB — HEPATIC FUNCTION PANEL
ALT: 23 U/L (ref 0–35)
AST: 27 U/L (ref 0–37)
Albumin: 4.2 g/dL (ref 3.5–5.2)
Alkaline Phosphatase: 32 U/L — ABNORMAL LOW (ref 39–117)
Bilirubin, Direct: 0 mg/dL (ref 0.0–0.3)
Total Bilirubin: 0.6 mg/dL (ref 0.3–1.2)
Total Protein: 7.2 g/dL (ref 6.0–8.3)

## 2012-12-01 LAB — LIPID PANEL
Cholesterol: 197 mg/dL (ref 0–200)
HDL: 96.9 mg/dL (ref >39.00)
LDL Cholesterol: 73 mg/dL (ref 0–99)
Total CHOL/HDL Ratio: 2
Triglycerides: 134 mg/dL (ref 0.0–149.0)
VLDL: 26.8 mg/dL (ref 0.0–40.0)

## 2012-12-01 LAB — BASIC METABOLIC PANEL
CO2: 23 mEq/L (ref 19–32)
Chloride: 104 mEq/L (ref 96–112)
Potassium: 4.6 mEq/L (ref 3.5–5.1)
Sodium: 138 mEq/L (ref 135–145)

## 2012-12-01 LAB — TSH: TSH: 1.7 u[IU]/mL (ref 0.35–5.50)

## 2012-12-01 LAB — HEMOGLOBIN A1C: Hgb A1c MFr Bld: 7.3 % — ABNORMAL HIGH (ref 4.6–6.5)

## 2012-12-07 ENCOUNTER — Ambulatory Visit: Payer: Medicare Other | Admitting: Internal Medicine

## 2012-12-22 ENCOUNTER — Ambulatory Visit (INDEPENDENT_AMBULATORY_CARE_PROVIDER_SITE_OTHER): Payer: Medicare Other | Admitting: Internal Medicine

## 2012-12-22 ENCOUNTER — Encounter: Payer: Self-pay | Admitting: Internal Medicine

## 2012-12-22 VITALS — BP 146/62 | HR 105 | Temp 98.2°F | Wt 151.0 lb

## 2012-12-22 DIAGNOSIS — E119 Type 2 diabetes mellitus without complications: Secondary | ICD-10-CM

## 2012-12-22 DIAGNOSIS — H269 Unspecified cataract: Secondary | ICD-10-CM

## 2012-12-22 DIAGNOSIS — N259 Disorder resulting from impaired renal tubular function, unspecified: Secondary | ICD-10-CM

## 2012-12-22 DIAGNOSIS — E785 Hyperlipidemia, unspecified: Secondary | ICD-10-CM

## 2012-12-22 DIAGNOSIS — I10 Essential (primary) hypertension: Secondary | ICD-10-CM

## 2012-12-22 MED ORDER — VALSARTAN 320 MG PO TABS: 320.0000 mg | ORAL_TABLET | Freq: Every day | ORAL | Status: DC

## 2012-12-22 NOTE — Patient Instructions (Signed)
Increase valsartan to 320 per day   This may or may not control the BP readings .   ROV in 1-2 months   Bmp pre visit . If not working to control bp may do better adding a low dose medication such as amlodipine.

## 2012-12-22 NOTE — Progress Notes (Signed)
Chief Complaint  Patient presents with  . Follow-up  . Diabetes  . Hypertension    HPI: Patient comes in today for follow up of  multiple medical problems.  Exercising good diet no se of meds   Increase insulin some  8- 10 untils   No lows numbness or unusual infections.  left thumb rheumatism  No trauma hurts some with gripping.  Cataracts  To see digby soon BP has been running higher than before in high 140s and some 150s  Feeling generally well although with the vision distorted sometimes feels not as competent in her balance. She still continues to exercise and TMs ROS: See pertinent positives and negatives per HPI.  Past Medical History  Diagnosis Date  . Diabetes mellitus   . Hyperlipidemia   . Hypertension   . Osteoporosis   . Bladder cancer     more than 5 years out    Family History  Problem Relation Age of Onset  . Leukemia Mother     History   Social History  . Marital Status: Married    Spouse Name: N/A    Number of Children: N/A  . Years of Education: N/A   Social History Main Topics  . Smoking status: Never Smoker   . Smokeless tobacco: None  . Alcohol Use: None  . Drug Use: None  . Sexually Active: None   Other Topics Concern  . 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 aspirin 81 MG chewable tablet Chew 81 mg by mouth daily.        . B-D ULTRAFINE III SHORT PEN 31G X 8 MM MISC USE 1 NEEDLE DAILY  90 each  1  . GLIPIZIDE XL 5 MG 24 hr tablet TAKE 1 TABLET DAILY  90 tablet  1  . glucose blood (FREESTYLE TEST STRIPS) test strip 1 each by Other route 4 (four) times daily. Use as instructed       . Lancets (FREESTYLE) lancets 1 each by Other route 4 (four) times daily. Use as instructed       . LANTUS SOLOSTAR 100 UNIT/ML injection  INJECT 6 TO 8 UNITS AT BEDTIME  9 mL  1  . MULTIPLE VITAMIN PO Take by mouth.        Marland Kitchen olopatadine (PATANOL) 0.1 % ophthalmic solution Place 1 drop into both eyes 2 (two) times daily.        . pioglitazone (ACTOS) 30 MG tablet TAKE 1 TABLET DAILY  90 tablet  1  . simvastatin (ZOCOR) 40 MG tablet TAKE 1 TABLET AT BEDTIME  90 tablet  2  . VITAMIN D, ERGOCALCIFEROL, PO Take by mouth.        . [DISCONTINUED] DIOVAN 160 MG tablet TAKE 1 TABLET DAILY  90 tablet  1  . JANUVIA 50 MG tablet TAKE 1 TABLET DAILY  90 tablet  2  . valsartan (DIOVAN) 320 MG tablet Take 1 tablet (320 mg total) by mouth daily.  90 tablet  3  . [DISCONTINUED] glipiZIDE (GLUCOTROL XL) 5 MG 24 hr tablet Take 5 mg by mouth daily.          EXAM:  BP 146/62  Pulse 105  Temp 98.2 F (36.8 C) (Oral)  Wt 151 lb (68.493 kg)  SpO2 96%  There is  no height on file to calculate BMI.  GENERAL: vitals reviewed and listed above, alert, oriented, appears well hydrated and in no acute distress  HEENT: atraumatic, conjunctiva  clear, no obvious abnormalities on inspection of external nose and ears OP : no lesion edema or exudate   NECK: no obvious masses on inspection palpation   LUNGS: clear to auscultation bilaterally, no wheezes, rales or rhonchi, good air movement  CV: HRRR, no clubbing cyanosis or  peripheral edema nl cap refill   MS: moves all extremities without noticeable focal  Abnormality   PSYCH: pleasant and cooperative, no obvious depression or anxiety Lab Results  Component Value Date   WBC 4.1* 09/06/2011   HGB 11.6* 09/06/2011   HCT 35.0* 09/06/2011   PLT 172.0 09/06/2011   GLUCOSE 141* 11/30/2012   CHOL 197 11/30/2012   TRIG 134.0 11/30/2012   HDL 96.90 11/30/2012   LDLDIRECT 97.1 09/19/2009   LDLCALC 73 11/30/2012   ALT 23 11/30/2012   AST 27 11/30/2012   NA 138 11/30/2012   K 4.6 11/30/2012   CL 104 11/30/2012   CREATININE 1.3* 11/30/2012   BUN 32* 11/30/2012   CO2 23 11/30/2012   TSH 1.70  11/30/2012   HGBA1C 7.3* 11/30/2012   MICROALBUR 3.0* 09/06/2011    ASSESSMENT AND PLAN:  Discussed the following assessment and plan:  1. DIABETES MELLITUS, TYPE II    a1c slightly up still acceptable for age and condition  2. HYPERTENSION    running high ;150s per pt  adjust meds options add CCB  will inc diovan first  for simplicity sake if helps  3. HYPERLIPIDEMIA   4. Cataract    may need surgery soon   5. RENAL INSUFFICIENCY stage 3  cr 1- 1.5    stable     -Patient advised to return or notify health care team  immediately if symptoms worsen or persist or new concerns arise.  Patient Instructions  Increase valsartan to 320 per day   This may or may not control the BP readings .   ROV in 1-2 months   Bmp pre visit . If not working to control bp may do better adding a low dose medication such as amlodipine.        Neta Mends. Barack Nicodemus M.D.

## 2012-12-24 ENCOUNTER — Ambulatory Visit: Payer: Medicare Other | Admitting: Internal Medicine

## 2013-02-15 ENCOUNTER — Other Ambulatory Visit (INDEPENDENT_AMBULATORY_CARE_PROVIDER_SITE_OTHER): Payer: Medicare Other

## 2013-02-15 DIAGNOSIS — I10 Essential (primary) hypertension: Secondary | ICD-10-CM

## 2013-02-15 LAB — BASIC METABOLIC PANEL
CO2: 21 mEq/L (ref 19–32)
Calcium: 9.8 mg/dL (ref 8.4–10.5)
Chloride: 106 mEq/L (ref 96–112)
Creatinine, Ser: 1.9 mg/dL — ABNORMAL HIGH (ref 0.4–1.2)
Glucose, Bld: 142 mg/dL — ABNORMAL HIGH (ref 70–99)

## 2013-02-22 ENCOUNTER — Ambulatory Visit (INDEPENDENT_AMBULATORY_CARE_PROVIDER_SITE_OTHER): Payer: Medicare Other | Admitting: Internal Medicine

## 2013-02-22 ENCOUNTER — Encounter: Payer: Self-pay | Admitting: Internal Medicine

## 2013-02-22 VITALS — BP 140/70 | HR 99 | Temp 99.0°F | Wt 151.0 lb

## 2013-02-22 DIAGNOSIS — D649 Anemia, unspecified: Secondary | ICD-10-CM

## 2013-02-22 DIAGNOSIS — E785 Hyperlipidemia, unspecified: Secondary | ICD-10-CM

## 2013-02-22 DIAGNOSIS — D631 Anemia in chronic kidney disease: Secondary | ICD-10-CM

## 2013-02-22 DIAGNOSIS — N259 Disorder resulting from impaired renal tubular function, unspecified: Secondary | ICD-10-CM

## 2013-02-22 DIAGNOSIS — E1129 Type 2 diabetes mellitus with other diabetic kidney complication: Secondary | ICD-10-CM | POA: Insufficient documentation

## 2013-02-22 DIAGNOSIS — N058 Unspecified nephritic syndrome with other morphologic changes: Secondary | ICD-10-CM

## 2013-02-22 DIAGNOSIS — I10 Essential (primary) hypertension: Secondary | ICD-10-CM

## 2013-02-22 NOTE — Patient Instructions (Signed)
I   think your monitor is pretty accurate for BP monitoring .  Get a repeat  Lab this week as we discussed.   Continue monitor your   Blood  pressure readings and if not below 140 on a regular basis the other intervention.   ROV in about a month to see .  Or   Other in the meantime.

## 2013-02-22 NOTE — Progress Notes (Signed)
Chief Complaint  Patient presents with  . Follow-up    BP.  Pt has brought her bp machine.    HPI: Patient comes in today for follow up of  multiple medical problems.   She brings in her blood pressure monitor today. She saw Dr. Allena Katz to 01/20/13; her blood pressure was noted to be elevated in the 148/70 range. Creatinine 1.38 potassium 5.5 low-dose hydrochlorothiazide 12.5 mg was added. Plan was to recheck lab work today. However she did have lab work from last week at our office. Her blood pressure readings at home over the last few days been in the 130 range however previously were in the 145 range. The goal is to get her blood pressure under control 140 ; if he HCTZ doesn't help to switch over to a calcium channel blocker.  She feels pretty well did have cataract surgery a couple weeks ago and will be gone for followup. DM no change ROS: See pertinent positives and negatives per HPI. No chest pain shortness of breath syncope.  Past Medical History  Diagnosis Date  . Diabetes mellitus   . Hyperlipidemia   . Hypertension   . Osteoporosis   . Bladder cancer     more than 5 years out    Family History  Problem Relation Age of Onset  . Leukemia Mother     History   Social History  . Marital Status: Married    Spouse Name: N/A    Number of Children: N/A  . Years of Education: N/A   Social History Main Topics  . Smoking status: Never Smoker   . Smokeless tobacco: None  . Alcohol Use: None  . Drug Use: None  . Sexually Active: None   Other Topics Concern  . None   Social History Narrative   Widowed   Retired Armed forces operational officer day school   From [place] originally   [place]moved to a new place i after husbands death   Exercises low impact zumba    Outpatient Encounter Prescriptions as of 02/22/2013  Medication Sig Dispense Refill  . aspirin 81 MG chewable tablet Chew 81 mg by mouth daily.        . B-D ULTRAFINE III SHORT PEN 31G X 8 MM MISC USE 1 NEEDLE DAILY  90 each  1   . GLIPIZIDE XL 5 MG 24 hr tablet TAKE 1 TABLET DAILY  90 tablet  1  . glucose blood (FREESTYLE TEST STRIPS) test strip 1 each by Other route 4 (four) times daily. Use as instructed       . hydrochlorothiazide (MICROZIDE) 12.5 MG capsule       . insulin glargine (LANTUS SOLOSTAR) 100 UNIT/ML injection       . JANUVIA 50 MG tablet TAKE 1 TABLET DAILY  90 tablet  2  . Lancets (FREESTYLE) lancets 1 each by Other route 4 (four) times daily. Use as instructed       . MULTIPLE VITAMIN PO Take by mouth.        Marland Kitchen olopatadine (PATANOL) 0.1 % ophthalmic solution Place 1 drop into both eyes 2 (two) times daily.       . pioglitazone (ACTOS) 30 MG tablet TAKE 1 TABLET DAILY  90 tablet  1  . simvastatin (ZOCOR) 40 MG tablet TAKE 1 TABLET AT BEDTIME  90 tablet  2  . valsartan (DIOVAN) 320 MG tablet Take 1 tablet (320 mg total) by mouth daily.  90 tablet  3  . VITAMIN D, ERGOCALCIFEROL, PO  Take by mouth.        . [DISCONTINUED] LANTUS SOLOSTAR 100 UNIT/ML injection INJECT 6 TO 8 UNITS AT BEDTIME  9 mL  1   No facility-administered encounter medications on file as of 02/22/2013.    EXAM:  BP 140/70  Pulse 99  Temp(Src) 99 F (37.2 C) (Oral)  Wt 151 lb (68.493 kg)  BMI 28.99 kg/m2  SpO2 97%  Body mass index is 28.99 kg/(m^2). Wt Readings from Last 3 Encounters:  02/22/13 151 lb (68.493 kg)  12/22/12 151 lb (68.493 kg)  08/06/12 146 lb (66.225 kg)     GENERAL: vitals reviewed and listed above, alert, oriented, appears well hydrated and in no acute distress Blood pressure reading office cuff right arm 138/70 and 142/70 her machine reading 146/74 146/80. HEENT: atraumatic NECK: no obvious masses on inspection palpation   CV: HRRR, no clubbing cyanosis or  peripheral edema nl cap refill   MS: moves all extremities without noticeable focal  abnormality  PSYCH: pleasant and cooperative, no obvious depression or anxiety Lab Results  Component Value Date   WBC 4.1* 09/06/2011   HGB 11.6*  09/06/2011   HCT 35.0* 09/06/2011   PLT 172.0 09/06/2011   GLUCOSE 142* 02/15/2013   CHOL 197 11/30/2012   TRIG 134.0 11/30/2012   HDL 96.90 11/30/2012   LDLDIRECT 97.1 09/19/2009   LDLCALC 73 11/30/2012   ALT 23 11/30/2012   AST 27 11/30/2012   NA 138 02/15/2013   K 4.6 02/15/2013   CL 106 02/15/2013   CREATININE 1.9* 02/15/2013   BUN 60* 02/15/2013   CO2 21 02/15/2013   TSH 1.70 11/30/2012   HGBA1C 7.3* 11/30/2012   MICROALBUR 3.0* 09/06/2011    ASSESSMENT AND PLAN:  Discussed the following assessment and plan:  RENAL INSUFFICIENCY stage 3  cr 1- 1.5 - elevated today 1.9  ? if from [facility] or other plan repeat via dr patel this week.   HYPERTENSION - borderline elevated goal less than 140  added hctz  HYPERLIPIDEMIA  Type 2 diabetes mellitus with renal manifestations  ANEMIA - noted to be 10.9 at dr patel  poss anemia of renal diseas?  Anemia of renal disease We'll send a copy of lab tests to Dr. Allena Katz but also get labs this week to check her creatinine again. Close followup within a month or as needed. -Patient advised to return or notify health care team  if symptoms worsen or persist or new concerns arise.  Patient Instructions  I   think your monitor is pretty accurate for BP monitoring .  Get a repeat  Lab this week as we discussed.   Continue monitor your   Blood  pressure readings and if not below 140 on a regular basis the other intervention.   ROV in about a month to see .  Or   Other in the meantime.       Neta Mends. Panosh M.D.  Total visit > 50% spent counseling and coordinating care  And blood pressure  Monitoring and review

## 2013-02-27 DIAGNOSIS — D631 Anemia in chronic kidney disease: Secondary | ICD-10-CM | POA: Insufficient documentation

## 2013-03-22 ENCOUNTER — Other Ambulatory Visit: Payer: Self-pay | Admitting: Internal Medicine

## 2013-03-25 ENCOUNTER — Ambulatory Visit: Payer: Medicare Other | Admitting: Internal Medicine

## 2013-03-25 ENCOUNTER — Encounter: Payer: Self-pay | Admitting: Dietician

## 2013-03-25 ENCOUNTER — Encounter: Payer: Medicare Other | Attending: Nephrology | Admitting: Dietician

## 2013-03-25 VITALS — Ht 60.25 in | Wt 149.8 lb

## 2013-03-25 DIAGNOSIS — N183 Chronic kidney disease, stage 3 unspecified: Secondary | ICD-10-CM | POA: Insufficient documentation

## 2013-03-25 DIAGNOSIS — E1129 Type 2 diabetes mellitus with other diabetic kidney complication: Secondary | ICD-10-CM

## 2013-03-25 DIAGNOSIS — E119 Type 2 diabetes mellitus without complications: Secondary | ICD-10-CM | POA: Insufficient documentation

## 2013-03-25 DIAGNOSIS — I129 Hypertensive chronic kidney disease with stage 1 through stage 4 chronic kidney disease, or unspecified chronic kidney disease: Secondary | ICD-10-CM | POA: Insufficient documentation

## 2013-03-25 DIAGNOSIS — Z713 Dietary counseling and surveillance: Secondary | ICD-10-CM | POA: Insufficient documentation

## 2013-03-25 NOTE — Progress Notes (Signed)
Medical Nutrition Therapy:  Appt start time: 1045 end time:  1230.   Assessment:  Primary concerns today: Concern for potassium levels in foods and how to eat to maintain safe potassium levels.   Has a history of type 2 diabetes for 20 years and has worked to maintain her diet.  Has had her bladder cancer with and is status post total cystectomy and urinary drainage via ileal conduit.  Has diabetes, hypertension, has chronic kidney disease, stage 3 with only the LF kidney funvtioning. Currently has been placed on a  Potassium and sodium restricted diet and experienced episodes of high potassium levels and one episode of hyperkalemia at 6.1 mg necessitating Kayexlate.  Frustrated with the diet, in that it is contrary to what she was taught when she developed type 2 diabetes. She is about controlling her blood glucose levels and finding use of the refined breads/grains/cereals to be difficult.  MEDICATIONS: Med review completed. Diabetes medications: Glipizide XL 5 mgm; Lantus 8-10 units daily; Januvia 50 mg daily; and Pioglitazone 30 mg daily.  DILATED EYE EXAM:  Recently completed with cataract surgery.  DAILY SELF-FOOT EXAM:  Completes daily.  Blood Glucose Monitoring:  Fasting: 83-103 mg  Before Dinner (6-6:30 PM) 100-107 mg   DIETARY INTAKE:  Usual eating pattern includes 3 meals and 1 snacks per day.  Everyday foods include variety of meats, fish, whole grain bread, starches, vegetables and fruits.  Avoided foods include:  Foods highly refined and high in carbohydrates.    24-hr recall:  B ( AM): Honey bunches of oats 3/4 cup and 2% milk and whole wheat toast with [employer] with Olive Oil and 2 cups of tea  Snk ( AM): none  L ( PM): Home: cheeese, slice chicken with tomato, yogurt. Rare sandwich.  Out with the ladies have 1/2 sandwich or soup or scrambled eggs.  Snk ( PM): veggies with dip some days D ( PM): salmon or lamb chop with starchy veggies Snk ( PM): Usually  none Beverages: tea, water  Usual physical activity: Not noted.  Estimated energy needs:  HT: 60.25 in    WT: 149.8 lb   BMI: 29.1 kg/m2   Adj WT: 116 lb (53 kg) 1300 calories 145-150 g carbohydrates 80-95  g protein 40-42 g fat  Progress Towards Goal(s):  In progress.   Nutritional Diagnosis:  [place]-2.1 Inpaired nutrition utilization As related to potassium.  As evidenced by history of stage 3 Renal Disease with recent episod of hyperkalemia with Kayalexalate treatment..    Intervention:  Nutrition   Give up the whole wheat/whole grain products due to potassium levels.  Plan to limit your potassium intake to 2000 mg per day.  Divide the 2000 mg into 500 mg for meals and leave 500 mg for snacks.  Plan to use the USDA web site for your potassium reference.  Make out a list of the foods you like and go to the web site and obtain the potassium level for the given serving.  Obtain a scale that will allow you to weigh your foods in grams.  Plan to leach the potatoes, carrots, turnips, etc. The leaching will remove some of the potassium.  You need to plan earlier in the day in order to complete the leaching process.  Practice measuring and get to learn the amount of potassium for some of the foods that you eat when dining out.  Make yourself a spread sheet of your personal findings.   Handouts given during  visit include:  Potassium Levels for vegetables and fruits.  ADA resource for potassium and sodium levels.  Directions for Leaching foods.  Monitoring/Evaluation:  Dietary intake, exercise, blood glucose, and body weight call, e-mail, follo-up as you need.Marland Kitchen

## 2013-03-25 NOTE — Patient Instructions (Addendum)
   Give up the whole wheat/whole grain products due to potassium levels.  Plan to limit your potassium intake to 2000 mg per day.  Divide the 2000 mg into 500 mg for meals and leave 500 mg for snacks.  Plan to use the USDA web site for your potassium reference.  Make out a list of the foods you like and go to the web site and obtain the potassium level for the given serving.  Obtain a scale that will allow you to weigh your foods in grams.  Plan to leach the potatoes, carrots, turnips, etc. The leaching will remove some of the potassium.  You need to plan earlier in the day in order to complete the leaching process.  Practice measuring and get to learn the amount of potassium for some of the foods that you eat when dining out.  Make yourself a spread sheet of your personal findings.

## 2013-03-26 ENCOUNTER — Ambulatory Visit (INDEPENDENT_AMBULATORY_CARE_PROVIDER_SITE_OTHER): Payer: Medicare Other | Admitting: Internal Medicine

## 2013-03-26 ENCOUNTER — Encounter: Payer: Self-pay | Admitting: Internal Medicine

## 2013-03-26 VITALS — BP 106/60 | HR 100 | Temp 98.5°F | Wt 150.0 lb

## 2013-03-26 DIAGNOSIS — D649 Anemia, unspecified: Secondary | ICD-10-CM

## 2013-03-26 DIAGNOSIS — E1129 Type 2 diabetes mellitus with other diabetic kidney complication: Secondary | ICD-10-CM

## 2013-03-26 DIAGNOSIS — N259 Disorder resulting from impaired renal tubular function, unspecified: Secondary | ICD-10-CM

## 2013-03-26 DIAGNOSIS — N189 Chronic kidney disease, unspecified: Secondary | ICD-10-CM

## 2013-03-26 DIAGNOSIS — D631 Anemia in chronic kidney disease: Secondary | ICD-10-CM

## 2013-03-26 DIAGNOSIS — N058 Unspecified nephritic syndrome with other morphologic changes: Secondary | ICD-10-CM

## 2013-03-26 DIAGNOSIS — I10 Essential (primary) hypertension: Secondary | ICD-10-CM

## 2013-03-26 LAB — CBC WITH DIFFERENTIAL/PLATELET
Basophils Relative: 0.4 % (ref 0.0–3.0)
Eosinophils Absolute: 0.1 10*3/uL (ref 0.0–0.7)
Eosinophils Relative: 1.7 % (ref 0.0–5.0)
HCT: 34.3 % — ABNORMAL LOW (ref 36.0–46.0)
Lymphs Abs: 1.3 10*3/uL (ref 0.7–4.0)
MCHC: 33.7 g/dL (ref 30.0–36.0)
MCV: 91.1 fl (ref 78.0–100.0)
Monocytes Absolute: 0.6 10*3/uL (ref 0.1–1.0)
Platelets: 221 10*3/uL (ref 150.0–400.0)
WBC: 5.1 10*3/uL (ref 4.5–10.5)

## 2013-03-26 LAB — BASIC METABOLIC PANEL
BUN: 59 mg/dL — ABNORMAL HIGH (ref 6–23)
CO2: 22 mEq/L (ref 19–32)
Chloride: 103 mEq/L (ref 96–112)
Creatinine, Ser: 1.9 mg/dL — ABNORMAL HIGH (ref 0.4–1.2)
Potassium: 4.6 mEq/L (ref 3.5–5.1)

## 2013-03-26 NOTE — Progress Notes (Signed)
Chief Complaint  Patient presents with  . Follow-up    HPI: Patient comes in today for follow up of  multiple medical problems.  Has had increasing cr and then potassium of 6.1  Went to a nutritionist  And now having to change diet  To decrease potassium. And inc carbs difficult for her to do but bg is doing ok .  Decrease diovan to 160  1/2 of 320 and  Changes microzide to lasix  1/2 20 mg per day/.   BP now  Coming down.  To 120 - 130. Range this past weeks   Potassium last done was 5.5 in our readings  May have had previous ones 2 cataract surgeries  And pollen issue.  ROS: See pertinent positives and negatives per HPI. No cv neuro sx at present  Going to [place]   In the summer . With family   Past Medical History  Diagnosis Date  . Diabetes mellitus   . Hyperlipidemia   . Hypertension   . Osteoporosis   . Bladder cancer     more than 5 years out    Family History  Problem Relation Age of Onset  . Leukemia Mother   . Heart attack Other     History   Social History  . Marital Status: Married    Spouse Name: N/A    Number of Children: N/A  . Years of Education: N/A   Social History Main Topics  . Smoking status: Never Smoker   . Smokeless tobacco: None  . Alcohol Use: None  . Drug Use: None  . Sexually Active: None   Other Topics Concern  . 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 aspirin 81 MG chewable tablet Chew 81 mg by mouth daily.        . B-D ULTRAFINE III SHORT PEN 31G X 8 MM MISC USE 1 NEEDLE DAILY  90 each  1  . DOXYCYCLINE HYCLATE PO Take 50 mg by mouth 2 (two) times daily.      . furosemide (LASIX) 20 MG tablet Take 10 mg by mouth daily.      Marland Kitchen GLIPIZIDE XL 5 MG 24 hr tablet TAKE 1 TABLET DAILY  90 tablet  1   . glucose blood (FREESTYLE TEST STRIPS) test strip 1 each by Other route 4 (four) times daily. Use as instructed       . insulin glargine (LANTUS SOLOSTAR) 100 UNIT/ML injection       . JANUVIA 50 MG tablet TAKE 1 TABLET DAILY  90 tablet  1  . Lancets (FREESTYLE) lancets 1 each by Other route 4 (four) times daily. Use as instructed       . MULTIPLE VITAMIN PO Take by mouth.        Marland Kitchen olopatadine (PATANOL) 0.1 % ophthalmic solution Place 1 drop into both eyes 2 (two) times daily.       . pioglitazone (ACTOS) 30 MG tablet TAKE 1 TABLET DAILY  90 tablet  1  . simvastatin (ZOCOR) 40 MG tablet TAKE 1 TABLET AT BEDTIME  90 tablet  1  . valsartan (DIOVAN) 320 MG tablet Take 160 mg by mouth daily.      Marland Kitchen VITAMIN D, ERGOCALCIFEROL, PO  Take by mouth.        . [DISCONTINUED] valsartan (DIOVAN) 320 MG tablet Take 1 tablet (320 mg total) by mouth daily.  90 tablet  3  . [DISCONTINUED] hydrochlorothiazide (MICROZIDE) 12.5 MG capsule        No facility-administered encounter medications on file as of 03/26/2013.    EXAM:  BP 106/60  Pulse 100  Temp(Src) 98.5 F (36.9 C) (Oral)  Wt 150 lb (68.04 kg)  BMI 29.07 kg/m2  SpO2 97%  Body mass index is 29.07 kg/(m^2).  GENERAL: vitals reviewed and listed above, alert, oriented, appears well hydrated and in no acute distress  HEENT: atraumatic, conjunctiva  clear, no obvious abnormalities on inspection of external nose and ears OP : no lesion edema or exudate   NECK: no obvious masses on inspection palpation  No bruits   LUNGS: clear to auscultation bilaterally, no wheezes, rales or rhonchi, good air movement  CV: HRRR, no clubbing cyanosis or  peripheral edema nl cap refill   MS: moves all extremities without noticeable focal  abnormality  PSYCH: pleasant and cooperative, no obvious depression or anxiety Lab Results  Component Value Date   WBC 5.1 03/26/2013   HGB 11.5* 03/26/2013   HCT 34.3* 03/26/2013   PLT 221.0 03/26/2013   GLUCOSE 124*  03/26/2013   CHOL 197 11/30/2012   TRIG 134.0 11/30/2012   HDL 96.90 11/30/2012   LDLDIRECT 97.1 09/19/2009   LDLCALC 73 11/30/2012   ALT 23 11/30/2012   AST 27 11/30/2012   NA 134* 03/26/2013   K 4.6 03/26/2013   CL 103 03/26/2013   CREATININE 1.9* 03/26/2013   BUN 59* 03/26/2013   CO2 22 03/26/2013   TSH 1.70 11/30/2012   HGBA1C 6.8* 03/26/2013   MICROALBUR 3.0* 09/06/2011   reveiwd labs from dr Jill Alexanders office last creatinine in the 2 range potassium 5.5 call intact and her ALT this may send him for another BMP arrived  after patient left;K  5.1, creatinine 1.87  But bun   68.  gfr 25  Est   Reviewed her blood sugars and they are all in the acceptable range fasting below 120 mostly ASSESSMENT AND PLAN:  Discussed the following assessment and plan:  Type 2 diabetes mellitus with renal manifestations - Parentlike control this had to do dietary changes she is due for an A1c - Plan: valsartan (DIOVAN) 320 MG tablet, DOXYCYCLINE HYCLATE PO, Hemoglobin A1c, Basic metabolic panel, CBC with Differential  HYPERTENSION - Good control this time medicines were readjusted - Plan: valsartan (DIOVAN) 320 MG tablet, DOXYCYCLINE HYCLATE PO, Hemoglobin A1c, Basic metabolic panel, CBC with Differential  ANEMIA - Plan: valsartan (DIOVAN) 320 MG tablet, DOXYCYCLINE HYCLATE PO, Hemoglobin A1c, Basic metabolic panel, CBC with Differential  Anemia of renal disease - Plan: valsartan (DIOVAN) 320 MG tablet, DOXYCYCLINE HYCLATE PO, Hemoglobin A1c, Basic metabolic panel, CBC with Differential  RENAL INSUFFICIENCY stage 3  cr 1.9  - BUN seems out of proportion high to her creatinine ;uncertain significance. We'll repeat todaywith her a1c  H x of hyperkalemia      Better on diet and lasix per report  Counseled. Plan fu depending on labs etc. -Patient advised to return or notify health care team  if symptoms worsen or persist or new concerns arise.  Patient Instructions  Will notify you  of labs when  available.  Continue lifestyle intervention healthy eating and exercise .    Neta Mends. Panosh M.D. Total visit > 50% spent counseling and  coordinating care

## 2013-03-26 NOTE — Patient Instructions (Addendum)
Will notify you  of labs when available.  Continue lifestyle intervention healthy eating and exercise .

## 2013-03-27 ENCOUNTER — Other Ambulatory Visit: Payer: Self-pay | Admitting: Internal Medicine

## 2013-04-01 ENCOUNTER — Telehealth: Payer: Self-pay | Admitting: Internal Medicine

## 2013-04-01 NOTE — Telephone Encounter (Addendum)
Pt would like results of her potassium test as soon as you can get to her.

## 2013-04-05 ENCOUNTER — Other Ambulatory Visit: Payer: Self-pay | Admitting: Family Medicine

## 2013-04-05 DIAGNOSIS — E119 Type 2 diabetes mellitus without complications: Secondary | ICD-10-CM

## 2013-04-05 NOTE — Telephone Encounter (Signed)
Left message on home phone for the pt to return my call.  Also noted on the results.  Will continue to try and reach the patient.  Will close this note and document on the labs.

## 2013-04-06 ENCOUNTER — Telehealth: Payer: Self-pay | Admitting: Internal Medicine

## 2013-04-06 NOTE — Telephone Encounter (Signed)
Pls call pt w/ results of blood work. Returning your call.

## 2013-04-06 NOTE — Telephone Encounter (Signed)
Noted  

## 2013-04-07 ENCOUNTER — Other Ambulatory Visit: Payer: Self-pay | Admitting: Family Medicine

## 2013-04-07 DIAGNOSIS — I10 Essential (primary) hypertension: Secondary | ICD-10-CM

## 2013-04-07 DIAGNOSIS — E119 Type 2 diabetes mellitus without complications: Secondary | ICD-10-CM

## 2013-06-10 ENCOUNTER — Other Ambulatory Visit: Payer: Self-pay | Admitting: Internal Medicine

## 2013-07-23 ENCOUNTER — Telehealth: Payer: Self-pay | Admitting: Internal Medicine

## 2013-07-23 NOTE — Telephone Encounter (Signed)
Spoke to the pt.  Informed her that [facility] will order what is needed by Dr. Eliane Decree office.  Informed her of the lab work that was ordered through our system.  She will call Dr.  Eliane Decree office and see what else is needed if anything.  She will call back to add orders if needed.

## 2013-07-23 NOTE — Telephone Encounter (Signed)
Patient Information:  Caller Name: [place]  Phone: [phone]  Patient: Cassandra Evans, Cassandra Evans  Gender: Female  DOB: [birth year]  Age: 79 Years  PCP: Berniece Andreas [facility])  Office Follow Up:  Does the office need to follow up with this patient?: Yes  Instructions For The Office: Patient requests a return call to inform her regarding recommendation for lab testing. Patient can be reached at [phone]. Patient gives verbal authorization to leave voice mail message.  RN Note:  Patient states she is scheduled for an appointment and lab testing that includes kidney function studies on 07/26/13. Patient states Dr. Kathe Mariner, at [facility], has also requested that patient have labwork " next week." Patient states she has an appt. at [facility] on 07/29/13.  Patient is inquiring if she needs to have labwork completed twice or if lab testing can be coordinated to include all necessary testing fo both Physician's and results be forwarded to Dr. Kathe Mariner at [facility]. Patient requests a return call to inform her regarding recommendation for lab testing. Patient can be reached at [phone]. Patient gives verbal authorization to leave voice mail message.  Symptoms  Reason For Call & Symptoms: Lab testing inquiry  Reviewed Health History In EMR: Yes  Reviewed Medications In EMR: Yes  Reviewed Allergies In EMR: Yes  Reviewed Surgeries / Procedures: Yes  Date of Onset of Symptoms: 07/23/2013  Guideline(s) Used:  No Protocol Available - Information Only  Disposition Per Guideline:   Discuss with PCP and Callback by Nurse Today  Reason For Disposition Reached:   Nursing judgment  Advice Given:  N/A  Patient Will Follow Care Advice:  YES

## 2013-07-26 ENCOUNTER — Other Ambulatory Visit (INDEPENDENT_AMBULATORY_CARE_PROVIDER_SITE_OTHER): Payer: Medicare Other

## 2013-07-26 DIAGNOSIS — I10 Essential (primary) hypertension: Secondary | ICD-10-CM

## 2013-07-26 DIAGNOSIS — E119 Type 2 diabetes mellitus without complications: Secondary | ICD-10-CM

## 2013-07-26 LAB — BASIC METABOLIC PANEL
BUN: 39 mg/dL — ABNORMAL HIGH (ref 6–23)
Chloride: 109 mEq/L (ref 96–112)
Creatinine, Ser: 1.4 mg/dL — ABNORMAL HIGH (ref 0.4–1.2)

## 2013-07-26 LAB — HEMOGLOBIN A1C: Hgb A1c MFr Bld: 7.2 % — ABNORMAL HIGH (ref 4.6–6.5)

## 2013-08-03 ENCOUNTER — Encounter: Payer: Self-pay | Admitting: Internal Medicine

## 2013-08-03 ENCOUNTER — Ambulatory Visit (INDEPENDENT_AMBULATORY_CARE_PROVIDER_SITE_OTHER): Payer: Medicare Other | Admitting: Internal Medicine

## 2013-08-03 VITALS — BP 120/50 | HR 92 | Temp 98.3°F | Wt 151.0 lb

## 2013-08-03 DIAGNOSIS — I10 Essential (primary) hypertension: Secondary | ICD-10-CM

## 2013-08-03 DIAGNOSIS — E1129 Type 2 diabetes mellitus with other diabetic kidney complication: Secondary | ICD-10-CM

## 2013-08-03 DIAGNOSIS — G479 Sleep disorder, unspecified: Secondary | ICD-10-CM

## 2013-08-03 DIAGNOSIS — N259 Disorder resulting from impaired renal tubular function, unspecified: Secondary | ICD-10-CM

## 2013-08-03 DIAGNOSIS — N058 Unspecified nephritic syndrome with other morphologic changes: Secondary | ICD-10-CM

## 2013-08-03 NOTE — Progress Notes (Signed)
Chief Complaint  Patient presents with  . Follow-up    renal function  . Diabetes    HPI: Patient comes in today for follow up of  multiple medical problems.  Over all feels well.   Bp readings  130/ range no se of med no sig edema cp sob   Dm  Good fasting   88    No lows no numbness change vision   Not eating as much   Has had to cut out tomatoes  Because of the potassium  Control and renal insuff   Less sleeping as well for 6 months . As advice taking 1/2 unisom at times   coudnt do colonscopy.   But may be due now  Will call gi  mammo last time was dx and was told needed order to repeat.   ROS: See pertinent positives and negatives per HPI. Some fatigue  byut no acute changes   Past Medical History  Diagnosis Date  . Diabetes mellitus   . Hyperlipidemia   . Hypertension   . Osteoporosis   . Bladder cancer     more than 5 years out    Family History  Problem Relation Age of Onset  . Leukemia Mother   . Heart attack Other     History   Social History  . Marital Status: Married    Spouse Name: N/A    Number of Children: N/A  . Years of Education: N/A   Social History Main Topics  . Smoking status: Never Smoker   . Smokeless tobacco: None  . Alcohol Use: None  . Drug Use: None  . Sexual Activity: None   Other Topics Concern  . 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 aspirin 81 MG chewable tablet Chew 81 mg by mouth daily.        . B-D ULTRAFINE III SHORT PEN 31G X 8 MM MISC USE 1 NEEDLE DAILY  3 each  0  . furosemide (LASIX) 20 MG tablet Take 10 mg by mouth daily.      Marland Kitchen GLIPIZIDE XL 5 MG 24 hr tablet TAKE 1 TABLET DAILY  90 tablet  0  . glucose blood (FREESTYLE TEST STRIPS) test strip 1 each by Other route 4  (four) times daily. Use as instructed       . insulin glargine (LANTUS SOLOSTAR) 100 UNIT/ML injection       . JANUVIA 50 MG tablet TAKE 1 TABLET DAILY  90 tablet  1  . Lancets (FREESTYLE) lancets 1 each by Other route 4 (four) times daily. Use as instructed       . LANTUS SOLOSTAR 100 UNIT/ML injection INJECT 6 TO 8 UNITS AT BEDTIME  3 pen  1  . MULTIPLE VITAMIN PO Take by mouth.        Marland Kitchen olopatadine (PATANOL) 0.1 % ophthalmic solution Place 1 drop into both eyes 2 (two) times daily.       . pioglitazone (ACTOS) 30 MG tablet TAKE 1 TABLET DAILY  90 tablet  1  . simvastatin (ZOCOR) 40 MG tablet TAKE 1 TABLET AT BEDTIME  90 tablet  1  . valsartan (DIOVAN) 320 MG tablet Take 160 mg by mouth daily.      Marland Kitchen  VITAMIN D, ERGOCALCIFEROL, PO Take by mouth.        . [DISCONTINUED] DOXYCYCLINE HYCLATE PO Take 50 mg by mouth 2 (two) times daily.       No facility-administered encounter medications on file as of 08/03/2013.    EXAM:  BP 120/50  Pulse 92  Temp(Src) 98.3 F (36.8 C) (Oral)  Wt 151 lb (68.493 kg)  BMI 29.26 kg/m2  SpO2 98%  Body mass index is 29.26 kg/(m^2).  GENERAL: vitals reviewed and listed above, alert, oriented, appears well hydrated and in no acute distress HEENT: atraumatic, conjunctiva  clear, no obvious abnormalities on inspection of external nose and ears  NECK: no obvious masses on inspection palpation  LUNGS: clear to auscultation bilaterally, no wheezes, rales or rhonchi, good air movement CV: HRRR, no clubbing cyanosis or  peripheral edema nl cap refill  MS: moves all extremities without noticeable focal  Abnormality steady gait  PSYCH: pleasant and cooperative, no obvious depression or anxiety Wt Readings from Last 3 Encounters:  08/03/13 151 lb (68.493 kg)  03/26/13 150 lb (68.04 kg)  03/25/13 149 lb 12.8 oz (67.949 kg)   Lab Results  Component Value Date   WBC 5.1 03/26/2013   HGB 11.5* 03/26/2013   HCT 34.3* 03/26/2013   PLT 221.0 03/26/2013   GLUCOSE 114*  07/26/2013   CHOL 197 11/30/2012   TRIG 134.0 11/30/2012   HDL 96.90 11/30/2012   LDLDIRECT 97.1 09/19/2009   LDLCALC 73 11/30/2012   ALT 23 11/30/2012   AST 27 11/30/2012   NA 139 07/26/2013   K 4.8 07/26/2013   CL 109 07/26/2013   CREATININE 1.4* 07/26/2013   BUN 39* 07/26/2013   CO2 23 07/26/2013   TSH 1.70 11/30/2012   HGBA1C 7.2* 07/26/2013   MICROALBUR 3.0* 09/06/2011    ASSESSMENT AND PLAN:  Discussed the following assessment and plan:  Type 2 diabetes mellitus with renal manifestations  RENAL INSUFFICIENCY stage 3  cr 1- 1.5  HYPERTENSION  Sleep disturbance - risk benefit strategies discussed   -Patient advised to return or notify health care team  if symptoms worsen or persist or new concerns arise.  Patient Instructions  Continue lifestyle intervention healthy eating and exercise . BP. is good.     Can  Try melatonin  3-4 hours before sleep. Blood sugar is  Acceptable.  Can check blood sugar 2 - hours after eating to check  How high  It goes.    Insomnia Insomnia is frequent trouble falling and/or staying asleep. Insomnia can be a long term problem or a short term problem. Both are common. Insomnia can be a short term problem when the wakefulness is related to a certain stress or worry. Long term insomnia is often related to ongoing stress during waking hours and/or poor sleeping habits. Overtime, sleep deprivation itself can make the problem worse. Every little thing feels more severe because you are overtired and your ability to cope is decreased. CAUSES   Stress, anxiety, and depression.  Poor sleeping habits.  Distractions such as TV in the bedroom.  Naps close to bedtime.  Engaging in emotionally charged conversations before bed.  Technical reading before sleep.  Alcohol and other sedatives. They may make the problem worse. They can hurt normal sleep patterns and normal dream activity.  Stimulants such as caffeine for several hours prior to  bedtime.  Pain syndromes and shortness of breath can cause insomnia.  Exercise late at night.  Changing time zones may cause sleeping problems (  jet lag). It is sometimes helpful to have someone observe your sleeping patterns. They should look for periods of not breathing during the night (sleep apnea). They should also look to see how long those periods last. If you live alone or observers are uncertain, you can also be observed at a sleep clinic where your sleep patterns will be professionally monitored. Sleep apnea requires a checkup and treatment. Give your caregivers your medical history. Give your caregivers observations your family has made about your sleep.  SYMPTOMS   Not feeling rested in the morning.  Anxiety and restlessness at bedtime.  Difficulty falling and staying asleep. TREATMENT   Your caregiver may prescribe treatment for an underlying medical disorders. Your caregiver can give advice or help if you are using alcohol or other drugs for self-medication. Treatment of underlying problems will usually eliminate insomnia problems.  Medications can be prescribed for short time use. They are generally not recommended for lengthy use.  Over-the-counter sleep medicines are not recommended for lengthy use. They can be habit forming.  You can promote easier sleeping by making lifestyle changes such as:  Using relaxation techniques that help with breathing and reduce muscle tension.  Exercising earlier in the day.  Changing your diet and the time of your last meal. No night time snacks.  Establish a regular time to go to bed.  Counseling can help with stressful problems and worry.  Soothing music and white noise may be helpful if there are background noises you cannot remove.  Stop tedious detailed work at least one hour before bedtime. HOME CARE INSTRUCTIONS   Keep a diary. Inform your caregiver about your progress. This includes any medication side effects. See your  caregiver regularly. Take note of:  Times when you are asleep.  Times when you are awake during the night.  The quality of your sleep.  How you feel the next day. This information will help your caregiver care for you.  Get out of bed if you are still awake after 15 minutes. Read or do some quiet activity. Keep the lights down. Wait until you feel sleepy and go back to bed.  Keep regular sleeping and waking hours. Avoid naps.  Exercise regularly.  Avoid distractions at bedtime. Distractions include watching television or engaging in any intense or detailed activity like attempting to balance the household checkbook.  Develop a bedtime ritual. Keep a familiar routine of bathing, brushing your teeth, climbing into bed at the same time each night, listening to soothing music. Routines increase the success of falling to sleep faster.  Use relaxation techniques. This can be using breathing and muscle tension release routines. It can also include visualizing peaceful scenes. You can also help control troubling or intruding thoughts by keeping your mind occupied with boring or repetitive thoughts like the old concept of counting sheep. You can make it more creative like imagining planting one beautiful flower after another in your backyard garden.  During your day, work to eliminate stress. When this is not possible use some of the previous suggestions to help reduce the anxiety that accompanies stressful situations. MAKE SURE YOU:   Understand these instructions.  Will watch your condition.  Will get help right away if you are not doing well or get worse. Document Released: 11/29/2000 Document Revised: 02/24/2012 Document Reviewed: 12/30/2007 [facility] Patient Information 2014 [place], [place].    Check into mammogram screening vs daignostic . Call about colon cancer screening .     Neta Mends. Panosh M.D.

## 2013-08-03 NOTE — Patient Instructions (Addendum)
Continue lifestyle intervention healthy eating and exercise . BP. is good.     Can  Try melatonin  3-4 hours before sleep. Blood sugar is  Acceptable.  Can check blood sugar 2 - hours after eating to check  How high  It goes.    Insomnia Insomnia is frequent trouble falling and/or staying asleep. Insomnia can be a long term problem or a short term problem. Both are common. Insomnia can be a short term problem when the wakefulness is related to a certain stress or worry. Long term insomnia is often related to ongoing stress during waking hours and/or poor sleeping habits. Overtime, sleep deprivation itself can make the problem worse. Every little thing feels more severe because you are overtired and your ability to cope is decreased. CAUSES   Stress, anxiety, and depression.  Poor sleeping habits.  Distractions such as TV in the bedroom.  Naps close to bedtime.  Engaging in emotionally charged conversations before bed.  Technical reading before sleep.  Alcohol and other sedatives. They may make the problem worse. They can hurt normal sleep patterns and normal dream activity.  Stimulants such as caffeine for several hours prior to bedtime.  Pain syndromes and shortness of breath can cause insomnia.  Exercise late at night.  Changing time zones may cause sleeping problems (jet lag). It is sometimes helpful to have someone observe your sleeping patterns. They should look for periods of not breathing during the night (sleep apnea). They should also look to see how long those periods last. If you live alone or observers are uncertain, you can also be observed at a sleep clinic where your sleep patterns will be professionally monitored. Sleep apnea requires a checkup and treatment. Give your caregivers your medical history. Give your caregivers observations your family has made about your sleep.  SYMPTOMS   Not feeling rested in the morning.  Anxiety and restlessness at  bedtime.  Difficulty falling and staying asleep. TREATMENT   Your caregiver may prescribe treatment for an underlying medical disorders. Your caregiver can give advice or help if you are using alcohol or other drugs for self-medication. Treatment of underlying problems will usually eliminate insomnia problems.  Medications can be prescribed for short time use. They are generally not recommended for lengthy use.  Over-the-counter sleep medicines are not recommended for lengthy use. They can be habit forming.  You can promote easier sleeping by making lifestyle changes such as:  Using relaxation techniques that help with breathing and reduce muscle tension.  Exercising earlier in the day.  Changing your diet and the time of your last meal. No night time snacks.  Establish a regular time to go to bed.  Counseling can help with stressful problems and worry.  Soothing music and white noise may be helpful if there are background noises you cannot remove.  Stop tedious detailed work at least one hour before bedtime. HOME CARE INSTRUCTIONS   Keep a diary. Inform your caregiver about your progress. This includes any medication side effects. See your caregiver regularly. Take note of:  Times when you are asleep.  Times when you are awake during the night.  The quality of your sleep.  How you feel the next day. This information will help your caregiver care for you.  Get out of bed if you are still awake after 15 minutes. Read or do some quiet activity. Keep the lights down. Wait until you feel sleepy and go back to bed.  Keep regular sleeping and waking  hours. Avoid naps.  Exercise regularly.  Avoid distractions at bedtime. Distractions include watching television or engaging in any intense or detailed activity like attempting to balance the household checkbook.  Develop a bedtime ritual. Keep a familiar routine of bathing, brushing your teeth, climbing into bed at the same time  each night, listening to soothing music. Routines increase the success of falling to sleep faster.  Use relaxation techniques. This can be using breathing and muscle tension release routines. It can also include visualizing peaceful scenes. You can also help control troubling or intruding thoughts by keeping your mind occupied with boring or repetitive thoughts like the old concept of counting sheep. You can make it more creative like imagining planting one beautiful flower after another in your backyard garden.  During your day, work to eliminate stress. When this is not possible use some of the previous suggestions to help reduce the anxiety that accompanies stressful situations. MAKE SURE YOU:   Understand these instructions.  Will watch your condition.  Will get help right away if you are not doing well or get worse. Document Released: 11/29/2000 Document Revised: 02/24/2012 Document Reviewed: 12/30/2007 [facility] Patient Information 2014 [place], [place].    Check into mammogram screening vs daignostic . Call about colon cancer screening .

## 2013-08-04 DIAGNOSIS — G479 Sleep disorder, unspecified: Secondary | ICD-10-CM | POA: Insufficient documentation

## 2013-08-11 ENCOUNTER — Encounter: Payer: Self-pay | Admitting: Internal Medicine

## 2013-08-31 ENCOUNTER — Other Ambulatory Visit: Payer: Self-pay | Admitting: Internal Medicine

## 2013-11-16 ENCOUNTER — Other Ambulatory Visit: Payer: Self-pay | Admitting: Internal Medicine

## 2013-11-25 ENCOUNTER — Telehealth: Payer: Self-pay | Admitting: Internal Medicine

## 2013-11-25 ENCOUNTER — Other Ambulatory Visit (INDEPENDENT_AMBULATORY_CARE_PROVIDER_SITE_OTHER): Payer: Medicare Other

## 2013-11-25 DIAGNOSIS — E785 Hyperlipidemia, unspecified: Secondary | ICD-10-CM

## 2013-11-25 DIAGNOSIS — E119 Type 2 diabetes mellitus without complications: Secondary | ICD-10-CM

## 2013-11-25 LAB — BASIC METABOLIC PANEL
BUN: 47 mg/dL — ABNORMAL HIGH (ref 6–23)
CO2: 24 mEq/L (ref 19–32)
Chloride: 108 mEq/L (ref 96–112)
Creatinine, Ser: 1.5 mg/dL — ABNORMAL HIGH (ref 0.4–1.2)
Potassium: 4.1 mEq/L (ref 3.5–5.1)

## 2013-11-25 LAB — LIPID PANEL
Cholesterol: 208 mg/dL — ABNORMAL HIGH (ref 0–200)
Total CHOL/HDL Ratio: 2
VLDL: 18.8 mg/dL (ref 0.0–40.0)

## 2013-11-25 LAB — LDL CHOLESTEROL, DIRECT: Direct LDL: 83.5 mg/dL

## 2013-11-25 NOTE — Telephone Encounter (Signed)
Pt states her prescription for glucose blood (FREESTYLE TEST STRIPS) test strip and Lancets (FREESTYLE) lancets needs renewal and she needs a new RX sent to [facility].  She only has 5 days left.  She was told the renewal request was sent in but she hasn't heard anything.

## 2013-12-13 ENCOUNTER — Ambulatory Visit: Payer: Medicare Other | Admitting: Internal Medicine

## 2013-12-17 ENCOUNTER — Encounter: Payer: Self-pay | Admitting: Internal Medicine

## 2013-12-17 ENCOUNTER — Ambulatory Visit (INDEPENDENT_AMBULATORY_CARE_PROVIDER_SITE_OTHER): Payer: Medicare Other | Admitting: Internal Medicine

## 2013-12-17 VITALS — BP 138/58 | HR 94 | Temp 98.0°F | Wt 150.0 lb

## 2013-12-17 DIAGNOSIS — E785 Hyperlipidemia, unspecified: Secondary | ICD-10-CM

## 2013-12-17 DIAGNOSIS — N189 Chronic kidney disease, unspecified: Secondary | ICD-10-CM

## 2013-12-17 DIAGNOSIS — Z23 Encounter for immunization: Secondary | ICD-10-CM

## 2013-12-17 DIAGNOSIS — N058 Unspecified nephritic syndrome with other morphologic changes: Secondary | ICD-10-CM

## 2013-12-17 DIAGNOSIS — I10 Essential (primary) hypertension: Secondary | ICD-10-CM

## 2013-12-17 DIAGNOSIS — N039 Chronic nephritic syndrome with unspecified morphologic changes: Secondary | ICD-10-CM

## 2013-12-17 DIAGNOSIS — N259 Disorder resulting from impaired renal tubular function, unspecified: Secondary | ICD-10-CM

## 2013-12-17 DIAGNOSIS — D631 Anemia in chronic kidney disease: Secondary | ICD-10-CM

## 2013-12-17 DIAGNOSIS — E1129 Type 2 diabetes mellitus with other diabetic kidney complication: Secondary | ICD-10-CM

## 2013-12-17 NOTE — Patient Instructions (Addendum)
No change in meds for now  Potassium is good .  Ask dr patel about the stool pattern changes  Otherwise  Consider probitoics   And if   persistent or progressive can see GI .   prevnar 13  Today . Plan wellness visit in 4-6 months

## 2013-12-17 NOTE — Progress Notes (Signed)
Chief Complaint  Patient presents with  . Follow-up    HPI: Patient comes in today for follow up of  multiple medical problems.  DM no lows  No new sx   BP no change in meds trying very hard to keep k in range  . Renal  No change meds  LIPIDS: no change  Has some loose stools ever since on lasix thinks its the medicine no blood weight loss . Doesn't feel sick with this .  Melatonin has helped sleep.    Health Maintenance  Topic Date Due  . Foot Exam  01/05/2012  . Ophthalmology Exam  05/28/2013  . Colonoscopy  06/20/2013  . Influenza Vaccine  07/16/2013  . Hemoglobin A1c  05/26/2014  . Mammogram  08/19/2014  . Tetanus/tdap  09/05/2021  . Pneumococcal Polysaccharide Vaccine Age 73 And Over  Completed  . Zostavax  Completed   Health Maintenance Review   ROS: See pertinent positives and negatives per HPI. No fever weight loss neuropathy sx  cv sx at this time    Past Medical History  Diagnosis Date  . Diabetes mellitus   . Hyperlipidemia   . Hypertension   . Osteoporosis   . Bladder cancer     more than 5 years out    Family History  Problem Relation Age of Onset  . Leukemia Mother   . Heart attack Other     History   Social History  . Marital Status: Married    Spouse Name: N/A    Number of Children: N/A  . Years of Education: N/A   Social History Main Topics  . Smoking status: Never Smoker   . Smokeless tobacco: None  . Alcohol Use: None  . Drug Use: None  . Sexual Activity: None   Other Topics Concern  . None   Social History Narrative   Widowed   Retired [employer]   From [place] originally   [place] ;moved to a new place [place] after husbands death   Exercises low impact zumba    Outpatient Encounter Prescriptions as of 12/17/2013  Medication Sig  . aspirin 81 MG chewable tablet Chew 81 mg by mouth daily.    . B-D ULTRAFINE III SHORT PEN 31G X 8 MM MISC USE 1 NEEDLE DAILY  . furosemide (LASIX) 20 MG tablet Take 10 mg by mouth daily.   Marland Kitchen GLIPIZIDE XL 5 MG 24 hr tablet TAKE 1 TABLET DAILY  . glucose blood (FREESTYLE TEST STRIPS) test strip 1 each by Other route 4 (four) times daily. Use as instructed   . insulin glargine (LANTUS SOLOSTAR) 100 UNIT/ML injection   . JANUVIA 50 MG tablet TAKE 1 TABLET DAILY  . Lancets (FREESTYLE) lancets 1 each by Other route 4 (four) times daily. Use as instructed   . LANTUS SOLOSTAR 100 UNIT/ML injection INJECT 6 TO 8 UNITS AT BEDTIME  . Melatonin 3 MG TABS Take 1 tablet by mouth at bedtime.  . MULTIPLE VITAMIN PO Take by mouth.    Marland Kitchen olopatadine (PATANOL) 0.1 % ophthalmic solution Place 1 drop into both eyes 2 (two) times daily.   . pioglitazone (ACTOS) 30 MG tablet TAKE 1 TABLET DAILY  . simvastatin (ZOCOR) 40 MG tablet TAKE 1 TABLET AT BEDTIME  . valsartan (DIOVAN) 320 MG tablet Take half a tablet daily  . VITAMIN D, ERGOCALCIFEROL, PO Take by mouth.    . [DISCONTINUED] DIOVAN 320 MG tablet TAKE 1 TABLET DAILY  . [DISCONTINUED] valsartan (DIOVAN) 320 MG  tablet Take 160 mg by mouth daily.    EXAM:  BP 138/58  Pulse 94  Temp(Src) 98 F (36.7 C) (Oral)  Wt 150 lb (68.04 kg)  SpO2 97%  Body mass index is 29.07 kg/(m^2).  GENERAL: vitals reviewed and listed above, alert, oriented, appears well hydrated and in no acute distress HEENT: atraumatic, conjunctiva  clear, no obvious abnormalities on inspection of external nose and ears  NECK: no obvious masses on inspection palpation no bruits heard  LUNGS: clear to auscultation bilaterally, no wheezes, rales or rhonchi, good air movement CV: HRRR, no clubbing cyanosis or  peripheral edema nl cap refill  MS: moves all extremities without noticeable focal  Abnormality Diabetic foot exam nl  PSYCH: pleasant and cooperative, no obvious depression or anxiety Lab Results  Component Value Date   WBC 5.1 03/26/2013   HGB 11.5* 03/26/2013   HCT 34.3* 03/26/2013   PLT 221.0 03/26/2013   GLUCOSE 140* 11/25/2013   CHOL 208* 11/25/2013   TRIG  94.0 11/25/2013   HDL 105.30 11/25/2013   LDLDIRECT 83.5 11/25/2013   LDLCALC 73 11/30/2012   ALT 23 11/30/2012   AST 27 11/30/2012   NA 140 11/25/2013   K 4.1 11/25/2013   CL 108 11/25/2013   CREATININE 1.5* 11/25/2013   BUN 47* 11/25/2013   CO2 24 11/25/2013   TSH 1.70 11/30/2012   HGBA1C 6.9* 11/25/2013   MICROALBUR 3.0* 09/06/2011    ASSESSMENT AND PLAN:  Discussed the following assessment and plan:  Type 2 diabetes mellitus with renal manifestations  Unspecified essential hypertension  Need for vaccination with 13-polyvalent pneumococcal conjugate vaccine - Plan: Pneumococcal conjugate vaccine 13-valent  Anemia of renal disease  HYPERLIPIDEMIA  RENAL INSUFFICIENCY stage 3  cr 1- 1.5 Disc lasix with dr patel.  Could be any meds  Consider probiotic but if  persistent or progressive may need to see GI again  -Patient advised to return or notify health care team  if symptoms worsen or persist or new concerns arise.  Patient Instructions  No change in meds for now  Potassium is good .  Ask dr patel about the stool pattern changes  Otherwise  Consider probitoics   And if   persistent or progressive can see GI .   prevnar 13  Today . Plan wellness visit in 4-6 months    Standley Brooking. Panosh M.D.  Pre visit review using our clinic review tool, if applicable. No additional management support is needed unless otherwise documented below in the visit note.

## 2013-12-20 LAB — HM DIABETES FOOT EXAM

## 2014-02-05 ENCOUNTER — Other Ambulatory Visit: Payer: Self-pay | Admitting: Internal Medicine

## 2014-02-07 ENCOUNTER — Ambulatory Visit (INDEPENDENT_AMBULATORY_CARE_PROVIDER_SITE_OTHER): Payer: Medicare Other | Admitting: Internal Medicine

## 2014-02-07 ENCOUNTER — Encounter: Payer: Self-pay | Admitting: Internal Medicine

## 2014-02-07 VITALS — BP 142/60 | HR 100 | Temp 98.7°F | Ht 60.25 in | Wt 147.0 lb

## 2014-02-07 DIAGNOSIS — E1129 Type 2 diabetes mellitus with other diabetic kidney complication: Secondary | ICD-10-CM

## 2014-02-07 DIAGNOSIS — J189 Pneumonia, unspecified organism: Secondary | ICD-10-CM

## 2014-02-07 DIAGNOSIS — N259 Disorder resulting from impaired renal tubular function, unspecified: Secondary | ICD-10-CM

## 2014-02-07 DIAGNOSIS — R091 Pleurisy: Secondary | ICD-10-CM

## 2014-02-07 MED ORDER — LEVOFLOXACIN 500 MG PO TABS: 500.0000 mg | ORAL_TABLET | Freq: Every day | ORAL | Status: DC

## 2014-02-07 MED ORDER — HYDROCODONE-HOMATROPINE 5-1.5 MG/5ML PO SYRP: 5.0000 mL | ORAL_SOLUTION | ORAL | Status: DC | PRN

## 2014-02-07 NOTE — Patient Instructions (Addendum)
Concern about pnemonia  On the left side  Begin antibiotic . Get chest x ray . Fu in a week or earlier if not getting better quickly   Pneumonia, Adult Pneumonia is an infection of the lungs.  CAUSES Pneumonia may be caused by bacteria or a virus. Usually, these infections are caused by breathing infectious particles into the lungs (respiratory tract). SYMPTOMS   Cough.  Fever.  Chest pain.  Increased rate of breathing.  Wheezing.  Mucus production. DIAGNOSIS  If you have the common symptoms of pneumonia, your caregiver will typically confirm the diagnosis with a chest X-ray. The X-ray will show an abnormality in the lung (pulmonary infiltrate) if you have pneumonia. Other tests of your blood, urine, or sputum may be done to find the specific cause of your pneumonia. Your caregiver may also do tests (blood gases or pulse oximetry) to see how well your lungs are working. TREATMENT  Some forms of pneumonia may be spread to other people when you cough or sneeze. You may be asked to wear a mask before and during your exam. Pneumonia that is caused by bacteria is treated with antibiotic medicine. Pneumonia that is caused by the influenza virus may be treated with an antiviral medicine. Most other viral infections must run their course. These infections will not respond to antibiotics.  PREVENTION A pneumococcal shot (vaccine) is available to prevent a common bacterial cause of pneumonia. This is usually suggested for:  People over 89 years old.  Patients on chemotherapy.  People with chronic lung problems, such as bronchitis or emphysema.  People with immune system problems. If you are over 65 or have a high risk condition, you may receive the pneumococcal vaccine if you have not received it before. In some countries, a routine influenza vaccine is also recommended. This vaccine can help prevent some cases of pneumonia.You may be offered the influenza vaccine as part of your care. If  you smoke, it is time to quit. You may receive instructions on how to stop smoking. Your caregiver can provide medicines and counseling to help you quit. HOME CARE INSTRUCTIONS   Cough suppressants may be used if you are losing too much rest. However, coughing protects you by clearing your lungs. You should avoid using cough suppressants if you can.  Your caregiver may have prescribed medicine if he or she thinks your pneumonia is caused by a bacteria or influenza. Finish your medicine even if you start to feel better.  Your caregiver may also prescribe an expectorant. This loosens the mucus to be coughed up.  Only take over-the-counter or prescription medicines for pain, discomfort, or fever as directed by your caregiver.  Do not smoke. Smoking is a common cause of bronchitis and can contribute to pneumonia. If you are a smoker and continue to smoke, your cough may last several weeks after your pneumonia has cleared.  A cold steam vaporizer or humidifier in your room or home may help loosen mucus.  Coughing is often worse at night. Sleeping in a semi-upright position in a recliner or using a couple pillows under your head will help with this.  Get rest as you feel it is needed. Your body will usually let you know when you need to rest. SEEK IMMEDIATE MEDICAL CARE IF:   Your illness becomes worse. This is especially true if you are elderly or weakened from any other disease.  You cannot control your cough with suppressants and are losing sleep.  You begin coughing up blood.  You develop pain which is getting worse or is uncontrolled with medicines.  You have a fever.  Any of the symptoms which initially brought you in for treatment are getting worse rather than better.  You develop shortness of breath or chest pain. MAKE SURE YOU:   Understand these instructions.  Will watch your condition.  Will get help right away if you are not doing well or get worse. Document Released:  12/02/2005 Document Revised: 02/24/2012 Document Reviewed: 02/21/2011 [facility] Patient Information 2014 [place], [place].

## 2014-02-07 NOTE — Progress Notes (Signed)
Chief Complaint  Patient presents with  . Cough    Has no appetite and is not sleeping well.  Cough is not productive.  Started about 10 days ago.  . Fatigue    HPI: Patient comes in today for SDA for  new problem evaluation. Onset about 10 days ago and cough is not getting worse and onleft side .  Pain since 3 days ago    Hurts to breath and move certain way wonders if sore ribs . Cough worse at night and laying down some sob. No fever or chills  Some cold  Feeling.  Non productive cough and dec appetite . Feels sicker than a cold and not getting better .  BG are elevated for her." Not in charge of them" Onset like a cold and going on.  hsa been on low dose doxy 50 bid fro eye lid infection to see opth this week  ROS: See pertinent positives and negatives per HPI.no vd  syncope wheeze   Past Medical History  Diagnosis Date  . Diabetes mellitus   . Hyperlipidemia   . Hypertension   . Osteoporosis   . Bladder cancer     more than 5 years out    Family History  Problem Relation Age of Onset  . Leukemia Mother   . Heart attack Other     History   Social History  . Marital Status: Married    Spouse Name: N/A    Number of Children: N/A  . Years of Education: N/A   Social History Main Topics  . Smoking status: Never Smoker   . Smokeless tobacco: None  . Alcohol Use: None  . Drug Use: None  . Sexual Activity: None   Other Topics Concern  . None   Social History Narrative   Widowed   Retired [employer]   From [place] originally   [place] ;moved [place] a new place [place] after husbands death   Exercises low impact zumba    Outpatient Encounter Prescriptions as of 02/07/2014  Medication Sig  . aspirin 81 MG chewable tablet Chew 81 mg by mouth daily.    . B-D ULTRAFINE III SHORT PEN 31G X 8 MM MISC USE 1 NEEDLE DAILY  . doxycycline (VIBRAMYCIN) 50 MG capsule Take 50 mg by mouth 2 (two) times daily.  . furosemide (LASIX) 20 MG tablet Take 10 mg by mouth daily.   Marland Kitchen GLIPIZIDE XL 5 MG 24 hr tablet TAKE 1 TABLET DAILY  . glucose blood (FREESTYLE TEST STRIPS) test strip 1 each by Other route 4 (four) times daily. Use as instructed   . insulin glargine (LANTUS SOLOSTAR) 100 UNIT/ML injection   . JANUVIA 50 MG tablet TAKE 1 TABLET DAILY  . Lancets (FREESTYLE) lancets 1 each by Other route 4 (four) times daily. Use as instructed   . LANTUS SOLOSTAR 100 UNIT/ML injection INJECT 6 TO 8 UNITS AT BEDTIME  . Melatonin 3 MG TABS Take 1 tablet by mouth at bedtime.  . MULTIPLE VITAMIN PO Take by mouth.    Marland Kitchen olopatadine (PATANOL) 0.1 % ophthalmic solution Place 1 drop into both eyes 2 (two) times daily.   . pioglitazone (ACTOS) 30 MG tablet TAKE 1 TABLET DAILY  . simvastatin (ZOCOR) 40 MG tablet TAKE 1 TABLET AT BEDTIME  . valsartan (DIOVAN) 320 MG tablet Take half a tablet daily  . VITAMIN D, ERGOCALCIFEROL, PO Take by mouth.    Marland Kitchen HYDROcodone-homatropine (HYCODAN) 5-1.5 MG/5ML syrup Take 5 mLs by  mouth every 4 (four) hours as needed for cough.  Marland Kitchen levofloxacin (LEVAQUIN) 500 MG tablet Take 1 tablet (500 mg total) by mouth daily.  . [DISCONTINUED] GLIPIZIDE XL 5 MG 24 hr tablet TAKE 1 TABLET DAILY  . [DISCONTINUED] pioglitazone (ACTOS) 30 MG tablet TAKE 1 TABLET DAILY  . [DISCONTINUED] simvastatin (ZOCOR) 40 MG tablet TAKE 1 TABLET AT BEDTIME    EXAM:  BP 142/60  Pulse 100  Temp(Src) 98.7 F (37.1 C) (Oral)  Ht 5' 0.25" (1.53 m)  Wt 147 lb (66.679 kg)  BMI 28.48 kg/m2  SpO2 97%  Body mass index is 28.48 kg/(m^2). WDWN in NAD  quiet respirations; mildly congested  somewhat hoarse. Non toxic . HEENT: Normocephalic ;atraumatic , Eyes;  PERRL, EOMs  Full, lids and conjunctiva clear,,Ears: no deformities, canals nl, wax left hearing aids removed  TM landmarks normal, Nose: no deformity or discharge but congested;face non tender Mouth : OP clear without lesion or edema . Neck: Supple without adenopathy or masses or bruits Chest:   Dec ? bs with rales rhonchi  left based no rubs  CV:  S1-S2 no gallops or murmurs peripheral perfusion is normal Skin :nl perfusion and no acute rashes  MS: moves all extremities without noticeable focal  abnormality Skin: normal capillary refill ,turgor , color: No acute rashes ,petechiae or bruising  PSYCH: pleasant and cooperative, no obvious depression or anxiety  ASSESSMENT AND PLAN:  Discussed the following assessment and plan:  Pleurisy - Plan: DG Chest 2 View  CAP (community acquired pneumonia) - Plan: DG Chest 2 View  RENAL INSUFFICIENCY stage 3  cr 1- 1.5  Type 2 diabetes mellitus with renal manifestations Poss viral complicated infection clinical pna without fever  . Get x ray tomorrow begin antibiotic tonight .   Risk benefit of medication discussed. And check Bg for swings   Cough med for comfort  Caution cns se poss . rov next week or if not better in 2-3 days or as needed  -Patient advised to return or notify health care team  if symptoms worsen or persist or new concerns arise.  Patient Instructions  Concern about pnemonia  On the left side  Begin antibiotic . Get chest x ray . Fu in a week or earlier if not getting better quickly   Pneumonia, Adult Pneumonia is an infection of the lungs.  CAUSES Pneumonia may be caused by bacteria or a virus. Usually, these infections are caused by breathing infectious particles into the lungs (respiratory tract). SYMPTOMS   Cough.  Fever.  Chest pain.  Increased rate of breathing.  Wheezing.  Mucus production. DIAGNOSIS  If you have the common symptoms of pneumonia, your caregiver will typically confirm the diagnosis with a chest X-ray. The X-ray will show an abnormality in the lung (pulmonary infiltrate) if you have pneumonia. Other tests of your blood, urine, or sputum may be done to find the specific cause of your pneumonia. Your caregiver may also do tests (blood gases or pulse oximetry) to see how well your lungs are working. TREATMENT    Some forms of pneumonia may be spread to other people when you cough or sneeze. You may be asked to wear a mask before and during your exam. Pneumonia that is caused by bacteria is treated with antibiotic medicine. Pneumonia that is caused by the influenza virus may be treated with an antiviral medicine. Most other viral infections must run their course. These infections will not respond to antibiotics.  PREVENTION A pneumococcal  shot (vaccine) is available to prevent a common bacterial cause of pneumonia. This is usually suggested for:  People over 35 years old.  Patients on chemotherapy.  People with chronic lung problems, such as bronchitis or emphysema.  People with immune system problems. If you are over 65 or have a high risk condition, you may receive the pneumococcal vaccine if you have not received it before. In some countries, a routine influenza vaccine is also recommended. This vaccine can help prevent some cases of pneumonia.You may be offered the influenza vaccine as part of your care. If you smoke, it is time to quit. You may receive instructions on how to stop smoking. Your caregiver can provide medicines and counseling to help you quit. HOME CARE INSTRUCTIONS   Cough suppressants may be used if you are losing too much rest. However, coughing protects you by clearing your lungs. You should avoid using cough suppressants if you can.  Your caregiver may have prescribed medicine if he or she thinks your pneumonia is caused by a bacteria or influenza. Finish your medicine even if you start to feel better.  Your caregiver may also prescribe an expectorant. This loosens the mucus to be coughed up.  Only take over-the-counter or prescription medicines for pain, discomfort, or fever as directed by your caregiver.  Do not smoke. Smoking is a common cause of bronchitis and can contribute to pneumonia. If you are a smoker and continue to smoke, your cough may last several weeks after  your pneumonia has cleared.  A cold steam vaporizer or humidifier in your room or home may help loosen mucus.  Coughing is often worse at night. Sleeping in a semi-upright position in a recliner or using a couple pillows under your head will help with this.  Get rest as you feel it is needed. Your body will usually let you know when you need to rest. SEEK IMMEDIATE MEDICAL CARE IF:   Your illness becomes worse. This is especially true if you are elderly or weakened from any other disease.  You cannot control your cough with suppressants and are losing sleep.  You begin coughing up blood.  You develop pain which is getting worse or is uncontrolled with medicines.  You have a fever.  Any of the symptoms which initially brought you in for treatment are getting worse rather than better.  You develop shortness of breath or chest pain. MAKE SURE YOU:   Understand these instructions.  Will watch your condition.  Will get help right away if you are not doing well or get worse. Document Released: 12/02/2005 Document Revised: 02/24/2012 Document Reviewed: 02/21/2011 [facility] Patient Information 2014 [place], [place].    Standley Brooking. Sala Tague M.D.  Pre visit review using our clinic review tool, if applicable. No additional management support is needed unless otherwise documented below in the visit note.

## 2014-02-08 ENCOUNTER — Ambulatory Visit (INDEPENDENT_AMBULATORY_CARE_PROVIDER_SITE_OTHER)
Admission: RE | Admit: 2014-02-08 | Discharge: 2014-02-08 | Disposition: A | Discharge status: Home / Self Care | Payer: Medicare Other | Source: Ambulatory Visit | Attending: Internal Medicine | Admitting: Internal Medicine

## 2014-02-08 DIAGNOSIS — R091 Pleurisy: Secondary | ICD-10-CM

## 2014-02-08 DIAGNOSIS — J189 Pneumonia, unspecified organism: Secondary | ICD-10-CM

## 2014-02-15 ENCOUNTER — Encounter: Payer: Self-pay | Admitting: Internal Medicine

## 2014-02-15 ENCOUNTER — Ambulatory Visit (INDEPENDENT_AMBULATORY_CARE_PROVIDER_SITE_OTHER): Payer: Medicare Other | Admitting: Internal Medicine

## 2014-02-15 VITALS — BP 142/50 | Temp 98.2°F | Ht 60.25 in | Wt 151.0 lb

## 2014-02-15 DIAGNOSIS — J988 Other specified respiratory disorders: Secondary | ICD-10-CM

## 2014-02-15 DIAGNOSIS — R091 Pleurisy: Secondary | ICD-10-CM

## 2014-02-15 DIAGNOSIS — J22 Unspecified acute lower respiratory infection: Secondary | ICD-10-CM | POA: Insufficient documentation

## 2014-02-15 DIAGNOSIS — E1129 Type 2 diabetes mellitus with other diabetic kidney complication: Secondary | ICD-10-CM

## 2014-02-15 NOTE — Progress Notes (Signed)
Chief Complaint  Patient presents with  . Follow-up    HPI: FU of pleurisy and clinical pna but x ray showed bronchitis  She finished the medicine 2 days ago feels 75% better at least. The pain went away about 4-5 days ago heaviness in chest is better. Has severe he minimal chest symptoms cough is better energy level picking up as well as appetite but she's not yet back to normal blood sugars doing better.  ROS: See pertinent positives and negatives per HPI. No syncope dizziness hypoglycemia.  Past Medical History  Diagnosis Date  . Diabetes mellitus   . Hyperlipidemia   . Hypertension   . Osteoporosis   . Bladder cancer     more than 5 years out    Family History  Problem Relation Age of Onset  . Leukemia Mother   . Heart attack Other     History   Social History  . Marital Status: Married    Spouse Name: N/A    Number of Children: N/A  . Years of Education: N/A   Social History Main Topics  . Smoking status: Never Smoker   . Smokeless tobacco: None  . Alcohol Use: None  . Drug Use: None  . Sexual Activity: None   Other Topics Concern  . None   Social History Narrative   Widowed   Retired [employer]   From [place] originally   [place] ;moved to a new place [place] after husbands death   Exercises low impact zumba    Outpatient Encounter Prescriptions as of 02/15/2014  Medication Sig  . aspirin 81 MG chewable tablet Chew 81 mg by mouth daily.    . B-D ULTRAFINE III SHORT PEN 31G X 8 MM MISC USE 1 NEEDLE DAILY  . furosemide (LASIX) 20 MG tablet Take 10 mg by mouth daily.  Marland Kitchen GLIPIZIDE XL 5 MG 24 hr tablet TAKE 1 TABLET DAILY  . glucose blood (FREESTYLE TEST STRIPS) test strip 1 each by Other route 4 (four) times daily. Use as instructed   . insulin glargine (LANTUS SOLOSTAR) 100 UNIT/ML injection   . JANUVIA 50 MG tablet TAKE 1 TABLET DAILY  . Lancets (FREESTYLE) lancets 1 each by Other route 4 (four) times daily. Use as instructed   . LANTUS  SOLOSTAR 100 UNIT/ML injection INJECT 6 TO 8 UNITS AT BEDTIME  . Melatonin 3 MG TABS Take 1 tablet by mouth at bedtime.  . MULTIPLE VITAMIN PO Take by mouth.    Marland Kitchen olopatadine (PATANOL) 0.1 % ophthalmic solution Place 1 drop into both eyes 2 (two) times daily.   . pioglitazone (ACTOS) 30 MG tablet TAKE 1 TABLET DAILY  . simvastatin (ZOCOR) 40 MG tablet TAKE 1 TABLET AT BEDTIME  . valsartan (DIOVAN) 320 MG tablet Take half a tablet daily  . VITAMIN D, ERGOCALCIFEROL, PO Take by mouth.    . [DISCONTINUED] doxycycline (VIBRAMYCIN) 50 MG capsule Take 50 mg by mouth 2 (two) times daily.  . [DISCONTINUED] HYDROcodone-homatropine (HYCODAN) 5-1.5 MG/5ML syrup Take 5 mLs by mouth every 4 (four) hours as needed for cough.  . [DISCONTINUED] levofloxacin (LEVAQUIN) 500 MG tablet Take 1 tablet (500 mg total) by mouth daily.    EXAM:  BP 142/50  Temp(Src) 98.2 F (36.8 C) (Oral)  Ht 5' 0.25" (1.53 m)  Wt 151 lb (68.493 kg)  BMI 29.26 kg/m2  Body mass index is 29.26 kg/(m^2).  GENERAL: vitals reviewed and listed above, alert, oriented, appears well hydrated and in no  acute distress Well-groomed nontoxic no respiratory distress. HEENT: atraumatic, conjunctiva  clear, no obvious abnormalities on inspection of external nose and ears  NECK: no obvious masses on inspection palpation  LUNGS: clear to auscultation bilaterally, no wheezes, rales or rhonchi, good air movement CV: HRRR, no clubbing cyanosis or  peripheral edema nl cap refill  MS: moves all extremities without noticeable focal  abnormality PSYCH: pleasant and cooperative, no obvious depression or anxiety  ASSESSMENT AND PLAN:  Discussed the following assessment and plan:  Lower respiratory infection (e.g., bronchitis, pneumonia, pneumonitis, pulmonitis) - Clinically left lower lobe pneumonia although x-ray only showed bronchitis responded well to treatment. Expectant management  Pleurisy - Result  Type 2 diabetes mellitus with renal  manifestations - Stable  -Patient advised to return or notify health care team  if symptoms worsen or persist or new concerns arise.  Patient Instructions  Glad you are doing better. May be more tired than usual for another week or so . If  Lung problems not back to baseline  Contact s  otherwoise routine follow up.   Cassandra Evans. Cassandra Evans M.D.  Pre visit review using our clinic review tool, if applicable. No additional management support is needed unless otherwise documented below in the visit note.

## 2014-02-15 NOTE — Patient Instructions (Signed)
Glad you are doing better. May be more tired than usual for another week or so . If  Lung problems not back to baseline  Contact s  otherwoise routine follow up.

## 2014-02-17 ENCOUNTER — Telehealth: Payer: Self-pay

## 2014-02-17 NOTE — Telephone Encounter (Signed)
Relevant patient education mailed to patient.  

## 2014-04-12 ENCOUNTER — Other Ambulatory Visit: Payer: Self-pay | Admitting: Internal Medicine

## 2014-05-16 ENCOUNTER — Ambulatory Visit (INDEPENDENT_AMBULATORY_CARE_PROVIDER_SITE_OTHER): Payer: Medicare Other | Admitting: Internal Medicine

## 2014-05-16 ENCOUNTER — Encounter: Payer: Self-pay | Admitting: Internal Medicine

## 2014-05-16 VITALS — BP 134/60 | HR 98 | Temp 98.3°F | Ht 60.25 in | Wt 152.0 lb

## 2014-05-16 DIAGNOSIS — D631 Anemia in chronic kidney disease: Secondary | ICD-10-CM

## 2014-05-16 DIAGNOSIS — N259 Disorder resulting from impaired renal tubular function, unspecified: Secondary | ICD-10-CM

## 2014-05-16 DIAGNOSIS — Z0000 Encounter for general adult medical examination without abnormal findings: Secondary | ICD-10-CM

## 2014-05-16 DIAGNOSIS — E785 Hyperlipidemia, unspecified: Secondary | ICD-10-CM

## 2014-05-16 DIAGNOSIS — M25561 Pain in right knee: Secondary | ICD-10-CM

## 2014-05-16 DIAGNOSIS — I10 Essential (primary) hypertension: Secondary | ICD-10-CM

## 2014-05-16 DIAGNOSIS — M25569 Pain in unspecified knee: Secondary | ICD-10-CM

## 2014-05-16 DIAGNOSIS — N189 Chronic kidney disease, unspecified: Secondary | ICD-10-CM

## 2014-05-16 DIAGNOSIS — E1129 Type 2 diabetes mellitus with other diabetic kidney complication: Secondary | ICD-10-CM

## 2014-05-16 DIAGNOSIS — N039 Chronic nephritic syndrome with unspecified morphologic changes: Secondary | ICD-10-CM

## 2014-05-16 DIAGNOSIS — Z8551 Personal history of malignant neoplasm of bladder: Secondary | ICD-10-CM

## 2014-05-16 LAB — HEPATIC FUNCTION PANEL
ALT: 27 U/L (ref 0–35)
AST: 26 U/L (ref 0–37)
Albumin: 4.1 g/dL (ref 3.5–5.2)
Alkaline Phosphatase: 35 U/L — ABNORMAL LOW (ref 39–117)
BILIRUBIN DIRECT: 0.1 mg/dL (ref 0.0–0.3)
TOTAL PROTEIN: 7 g/dL (ref 6.0–8.3)
Total Bilirubin: 0.5 mg/dL (ref 0.2–1.2)

## 2014-05-16 LAB — CBC WITH DIFFERENTIAL/PLATELET
BASOS PCT: 0.5 % (ref 0.0–3.0)
Basophils Absolute: 0 10*3/uL (ref 0.0–0.1)
EOS ABS: 0.1 10*3/uL (ref 0.0–0.7)
Eosinophils Relative: 1.8 % (ref 0.0–5.0)
HCT: 34.8 % — ABNORMAL LOW (ref 36.0–46.0)
HEMOGLOBIN: 11.7 g/dL — AB (ref 12.0–15.0)
LYMPHS ABS: 1.1 10*3/uL (ref 0.7–4.0)
LYMPHS PCT: 21 % (ref 12.0–46.0)
MCHC: 33.7 g/dL (ref 30.0–36.0)
MCV: 92.1 fl (ref 78.0–100.0)
Monocytes Absolute: 0.6 10*3/uL (ref 0.1–1.0)
Monocytes Relative: 11.8 % (ref 3.0–12.0)
NEUTROS ABS: 3.4 10*3/uL (ref 1.4–7.7)
Neutrophils Relative %: 64.9 % (ref 43.0–77.0)
Platelets: 194 10*3/uL (ref 150.0–400.0)
RBC: 3.78 Mil/uL — ABNORMAL LOW (ref 3.87–5.11)
RDW: 14.8 % (ref 11.5–15.5)
WBC: 5.2 10*3/uL (ref 4.0–10.5)

## 2014-05-16 LAB — LIPID PANEL
CHOL/HDL RATIO: 2
Cholesterol: 203 mg/dL — ABNORMAL HIGH (ref 0–200)
HDL: 85.5 mg/dL (ref >39.00)
LDL Cholesterol: 76 mg/dL (ref 0–99)
Triglycerides: 207 mg/dL — ABNORMAL HIGH (ref 0.0–149.0)
VLDL: 41.4 mg/dL — ABNORMAL HIGH (ref 0.0–40.0)

## 2014-05-16 LAB — BASIC METABOLIC PANEL
BUN: 42 mg/dL — ABNORMAL HIGH (ref 6–23)
CALCIUM: 9.5 mg/dL (ref 8.4–10.5)
CHLORIDE: 106 meq/L (ref 96–112)
CO2: 24 meq/L (ref 19–32)
Creatinine, Ser: 1.6 mg/dL — ABNORMAL HIGH (ref 0.4–1.2)
GFR: 33.99 mL/min — ABNORMAL LOW (ref >60.00)
Glucose, Bld: 199 mg/dL — ABNORMAL HIGH (ref 70–99)
Potassium: 5 mEq/L (ref 3.5–5.1)
SODIUM: 138 meq/L (ref 135–145)

## 2014-05-16 LAB — TSH: TSH: 1.59 u[IU]/mL (ref 0.35–4.50)

## 2014-05-16 LAB — HEMOGLOBIN A1C: Hgb A1c MFr Bld: 7.3 % — ABNORMAL HIGH (ref 4.6–6.5)

## 2014-05-16 NOTE — Patient Instructions (Addendum)
Will arrange  Referral to sports medicine . About the knee . Pain  Assessment and  Advice about exercise . Do not take antiinflammatory because of kidney status Sometimes we can use topical nsaid   Instead. For now take tylenol .  Will notify you  of labs when available. ROV depending on how these are or 4-6 months  With bmp and hga1c previsit  Call dr Diona Browner office about need for colon cancer screening  Colonoscopy . And I will flag also .

## 2014-05-16 NOTE — Progress Notes (Signed)
Chief Complaint  Patient presents with  . Medicare Wellness  . Diabetes  . Hypertension  . Knee Pain    HPI: Patient comes in today for Preventive Medicare wellness visit . No major injuries, ed visits ,hospitalizations , new medications since last visit.  Bp ok when checked   BG  Just came back from [place] and was naughty and to settle down .  No lows   Having problem with right knee pain aches  No meds  jazzercises 3 x per week 1 hours and wants to continue  ? Could dhe hav arthritis  Renal sees yearly.   Uncertain if she no longer needs colonoscopy  Had difficult time with last one and ? Virtual colon advised or nothing cause of age   Health Maintenance  Topic Date Due  . Ophthalmology Exam  05/28/2013  . Colonoscopy  06/20/2013  . Hemoglobin A1c  05/26/2014  . Influenza Vaccine  07/16/2014  . Mammogram  08/19/2014  . Foot Exam  12/20/2014  . Tetanus/tdap  09/05/2021  . Pneumococcal Polysaccharide Vaccine Age 89 And Over  Completed  . Zostavax  Completed   Health Maintenance Review     Hearing: hearing aid no change  Vision:   Had surgery no change . Last eye check UTD  Safety:  Has smoke detector and wears seat belts.  No firearms. No excess sun exposure. Sees dentist regularly.  Falls:  No   Advance directive :  Reviewed  Has one.  Memory: Felt to be good  , no concern from her or her family.  Depression: No anhedonia unusual crying or depressive symptoms  Nutrition: Eats well balanced diet; adequate calcium and vitamin D. No swallowing chewing problems.  Injury: no major injuries in the last six months.  Other healthcare providers:  Reviewed today .  Social:  Lives alone widowed . No pets.   Preventive parameters: up-to-date  Reviewed   ADLS:   There are no problems or need for assistance  driving, feeding, obtaining food, dressing, toileting and bathing, managing money using phone. She is independent.  EXERCISE/ HABITS  Per week 3 x   jazzercise  No tobacco    etohsocial   ROS:  GEN/ HEENT: No fever, significant weight changes sweats headaches vision problems hearing changes,weatrs hearing aids CV/ PULM; No chest pain shortness of breath cough, syncope,edema  change in exercise tolerance. GI /GU: No adominal pain, vomiting, change in bowel habits. No blood in the stool. SKIN/HEME: ,no acute skin rashes suspicious lesions or bleeding. No lymphadenopathy, nodules, masses.  NEURO/ PSYCH:  No newneurologic signs such as weakness numbness. No depression anxiety. IMM/ Allergy: No unusual infections.  Allergy .   REST of 12 system review negative except as per HPI   Past Medical History  Diagnosis Date  . Diabetes mellitus   . Hyperlipidemia   . Hypertension   . Osteoporosis   . Bladder cancer     more than 5 years out    Family History  Problem Relation Age of Onset  . Leukemia Mother   . Heart attack Other     History   Social History  . Marital Status: Married    Spouse Name: N/A    Number of Children: N/A  . Years of Education: N/A   Social History Main Topics  . Smoking status: Never Smoker   . Smokeless tobacco: None  . Alcohol Use: None  . Drug Use: None  . Sexual Activity: None   Other  Topics Concern  . None   Social History Narrative   Widowed   Retired [employer]   From [place] originally   [place] ;moved to a new place i after husbands death   Exercises low impact zumba    Outpatient Encounter Prescriptions as of 05/16/2014  Medication Sig  . aspirin 81 MG chewable tablet Chew 81 mg by mouth daily.    . B-D ULTRAFINE III SHORT PEN 31G X 8 MM MISC USE 1 NEEDLE DAILY  . furosemide (LASIX) 20 MG tablet Take 10 mg by mouth daily.  Marland Kitchen GLIPIZIDE XL 5 MG 24 hr tablet TAKE 1 TABLET DAILY  . glucose blood (FREESTYLE TEST STRIPS) test strip 1 each by Other route 4 (four) times daily. Use as instructed   . JANUVIA 50 MG tablet TAKE 1 TABLET DAILY  . Lancets (FREESTYLE) lancets 1 each  by Other route 4 (four) times daily. Use as instructed   . LANTUS SOLOSTAR 100 UNIT/ML injection INJECT 6 TO 8 UNITS AT BEDTIME  . Melatonin 3 MG TABS Take 1 tablet by mouth at bedtime.  . MULTIPLE VITAMIN PO Take by mouth.    Marland Kitchen olopatadine (PATANOL) 0.1 % ophthalmic solution Place 1 drop into both eyes 2 (two) times daily.   . pioglitazone (ACTOS) 30 MG tablet TAKE 1 TABLET DAILY  . simvastatin (ZOCOR) 40 MG tablet TAKE 1 TABLET AT BEDTIME  . valsartan (DIOVAN) 320 MG tablet Take half a tablet daily  . VITAMIN D, ERGOCALCIFEROL, PO Take by mouth.    . [DISCONTINUED] insulin glargine (LANTUS SOLOSTAR) 100 UNIT/ML injection Using 8-10 units once daily    EXAM:  BP 134/60  Pulse 98  Temp(Src) 98.3 F (36.8 C) (Oral)  Ht 5' 0.25" (1.53 m)  Wt 152 lb (68.947 kg)  BMI 29.45 kg/m2  SpO2 97%  Body mass index is 29.45 kg/(m^2).  Physical Exam: Vital signs reviewed ASN:KNLZ is a well-developed well-nourished alert cooperative   who appears stated age in no acute distress.  HEENT: normocephalic atraumatic , Eyes: PERRL EOM's full, conjunctiva clear, Nares: paten,t no deformity discharge or tenderness., Ears: no deformity EAC's  ri cl left some wax TMs with normal landmarks. Mouth: clear OP, no lesions, edema.  Moist mucous membranes. Dentition in adequate repair. NECK: supple without masses, thyromegaly or bruits. CHEST/PULM:  Clear to auscultation and percussion breath sounds equal no wheeze , rales or rhonchi. No chest wall deformities or tenderness. CV: PMI is nondisplaced, S1 S2 no gallops, murmurs, rubs. Peripheral pulses are full without delay.No JVD .  ABDOMEN: Bowel sounds normal nontender  No guard or rebound, no hepato splenomegal no CVA tenderness.   puch in place Extremtities:  No clubbing cyanosis or edema,some vv  no acute joint swelling right knee no creptitus seems stable or redness no focal atrophy NEURO:  Oriented x3, cranial nerves 3-12 appear to be intact, no obvious focal  weakness,gait within normal limits no abnormal reflexes or asymmetrical SKIN: No acute rashes normal turgor, color, no bruising or petechiae. PSYCH: Oriented, good eye contact, no obvious depression anxiety, cognition and judgment appear normal. LN: no cervical axillary inguinal adenopathy No noted deficits in memory, attention, and speech. FOOT EXAM sens intact nl inspection  Lab Results  Component Value Date   WBC 5.1 03/26/2013   HGB 11.5* 03/26/2013   HCT 34.3* 03/26/2013   PLT 221.0 03/26/2013   GLUCOSE 140* 11/25/2013   CHOL 208* 11/25/2013   TRIG 94.0 11/25/2013   HDL 105.30 11/25/2013  LDLDIRECT 83.5 11/25/2013   LDLCALC 73 11/30/2012   ALT 23 11/30/2012   AST 27 11/30/2012   NA 140 11/25/2013   K 4.1 11/25/2013   CL 108 11/25/2013   CREATININE 1.5* 11/25/2013   BUN 47* 11/25/2013   CO2 24 11/25/2013   TSH 1.70 11/30/2012   HGBA1C 6.9* 11/25/2013   MICROALBUR 3.0* 09/06/2011    ASSESSMENT AND PLAN:  Discussed the following assessment and plan:  Medicare annual wellness visit, initial  [place]: Basic metabolic panel, CBC with Differential, Hemoglobin A1c, Hepatic function panel, Lipid panel, TSH  HYPERTENSION - Plan: Basic metabolic panel, CBC with Differential, Hemoglobin A1c, Hepatic function panel, Lipid panel, TSH  RENAL INSUFFICIENCY stage 3  cr 1- 1.5 - Plan: Basic metabolic panel, CBC with Differential, Hemoglobin A1c, Hepatic function panel, Lipid panel, TSH  NEOPLASM, MALIGNANT, BLADDER, HX OF - Plan: Basic metabolic panel, CBC with Differential, Hemoglobin A1c, Hepatic function panel, Lipid panel, TSH  Anemia of renal disease - Plan: Basic metabolic panel, CBC with Differential, Hemoglobin A1c, Hepatic function panel, Lipid panel, TSH  Right knee pain - poss oa early ?  wants to continue jazzersice if possible SM consult . tylenol ok no nsaid oral  - Plan: Ambulatory referral to Sports Medicine  Type 2 diabetes mellitus with renal  manifestations Overall stable and doing well  Wants to continue to exercise  Knee problematic  SM referral for now. rov in 4-6 months a1c and bmop porev isti  Lab today is not fasting  Patient Care Team: Burnis Medin, MD as PCP - General Peri Maris, MD (Obstetrics and Gynecology) Linton Rump, MD (Ophthalmology) Clayborne Dana. Posey Pronto, MD (Nephrology) Lorelle Gibbs, MD (Radiology)  Patient Instructions  Will arrange  Referral to sports medicine . About the knee . Pain  Assessment and  Advice about exercise . Do not take antiinflammatory because of kidney status Sometimes we can use topical nsaid   Instead. For now take tylenol .  Will notify you  of labs when available. ROV depending on how these are or 4-6 months  With bmp and hga1c previsit  Call dr Diona Browner office about need for colon cancer screening  Colonoscopy . And I will flag also .   Standley Brooking. Panosh M.D.

## 2014-05-17 ENCOUNTER — Telehealth: Payer: Self-pay | Admitting: Internal Medicine

## 2014-05-17 NOTE — Telephone Encounter (Signed)
Left patient a voice mail with recommendations.

## 2014-05-17 NOTE — Telephone Encounter (Signed)
Relevant patient education mailed to patient.  

## 2014-05-17 NOTE — Telephone Encounter (Signed)
Tell her that I have already talked to Dr Regis Bill about it yesterday. No need for recall colon due to incomplete exam last time. Had to have a BE. No polyps found.

## 2014-05-17 NOTE — Telephone Encounter (Signed)
Patient asking when/if she is to have a recall colonoscopy. Please, advise

## 2014-05-23 ENCOUNTER — Other Ambulatory Visit: Payer: Self-pay | Admitting: Internal Medicine

## 2014-05-24 ENCOUNTER — Other Ambulatory Visit (INDEPENDENT_AMBULATORY_CARE_PROVIDER_SITE_OTHER): Payer: Medicare Other

## 2014-05-24 ENCOUNTER — Ambulatory Visit (INDEPENDENT_AMBULATORY_CARE_PROVIDER_SITE_OTHER): Payer: Medicare Other | Admitting: Family Medicine

## 2014-05-24 ENCOUNTER — Ambulatory Visit (INDEPENDENT_AMBULATORY_CARE_PROVIDER_SITE_OTHER)
Admission: RE | Admit: 2014-05-24 | Discharge: 2014-05-24 | Disposition: A | Discharge status: Home / Self Care | Payer: Medicare Other | Source: Ambulatory Visit | Attending: Family Medicine | Admitting: Family Medicine

## 2014-05-24 ENCOUNTER — Encounter: Payer: Self-pay | Admitting: Family Medicine

## 2014-05-24 VITALS — BP 150/80 | HR 94 | Ht 60.25 in | Wt 153.0 lb

## 2014-05-24 DIAGNOSIS — M25569 Pain in unspecified knee: Secondary | ICD-10-CM

## 2014-05-24 DIAGNOSIS — M25561 Pain in right knee: Secondary | ICD-10-CM

## 2014-05-24 DIAGNOSIS — M1710 Unilateral primary osteoarthritis, unspecified knee: Secondary | ICD-10-CM

## 2014-05-24 NOTE — Patient Instructions (Signed)
Very nice to meet you Exercises 3 times  A week.  Ice 20 minutes 2 times a day Wear brace with activity.  Take tylenol 650 mg three times a day is the best evidence based medicine we have for arthritis.  Glucosamine sulfate 750mg  twice a day is a supplement that has been shown to help moderate to severe arthritis. Vitamin D 2000 IU daily Fish oil 2 grams daily.  Tumeric 500mg  twice daily.  Capsaicin topically up to four times a day may also help with pain. Cortisone injections are an option if these interventions do not seem to make a difference or need more relief.  If cortisone injections do not help, there are different types of shots that may help but they take longer to take effect.  We can discuss this at follow up.  It's important that you continue to stay active. Controlling your weight is important.  Water aerobics and cycling with low resistance are the best two types of exercise for arthritis. Come back and see me in 3 weeks.

## 2014-05-24 NOTE — Assessment & Plan Note (Signed)
Patient is likely to have severe osteoarthritis. X-rays ordered today. Patient does have renal disease so we will limited to over-the-counter medications at this time. We discussed icing protocol, and given a handout for home exercise program. Patient was also given a arthritic brace for the knee. This was fitted by me today. Patient will try over-the-counter medications as well. Patient will come back and see me again in 3 weeks. If continuing to have pain at that time I would consider an intra-articular injection.

## 2014-05-24 NOTE — Progress Notes (Signed)
  [facility] [place], [place] [address] Phone: [phone] Subjective:    I'm seeing this patient by the request  of:  Lottie Dawson, MD   CC: Knee pain  [id number] Cassandra Evans is a 80 y.o. female coming in with complaint of knee pain. Right knee. Patient states over the last 6-8 weeks it seems to be worsening. Patient states most of the pain is on the medial aspect. Denies any radiation down the leg or any numbness. Worse with taking steps and mostly going up. Patient denies any giving out on her, any locking, or any nighttime awakening. Patient has tried Tylenol with mild improvement. No other home modalities tried at this time. Denies any injury to this knee previously.     Past medical history, social, surgical and family history all reviewed in electronic medical record.   Review of Systems: No headache, visual changes, nausea, vomiting, diarrhea, constipation, dizziness, abdominal pain, skin rash, fevers, chills, night sweats, weight loss, swollen lymph nodes, body aches, joint swelling, muscle aches, chest pain, shortness of breath, mood changes.   Objective Blood pressure 150/80, pulse 94, height 5' 0.25" (1.53 m), weight 153 lb (69.4 kg), SpO2 97.00%.  General: No apparent distress alert and oriented x3 mood and affect normal, dressed appropriately.  HEENT: Pupils equal, extraocular movements intact  Respiratory: Patient's speak in full sentences and does not appear short of breath  Cardiovascular: No lower extremity edema, non tender, no erythema  Skin: Warm dry intact with no signs of infection or rash on extremities or on axial skeleton.  Abdomen: Soft nontender  Neuro: Cranial nerves II through XII are intact, neurovascularly intact in all extremities with 2+ DTRs and 2+ pulses.  Lymph: No lymphadenopathy of posterior or anterior cervical chain or axillae bilaterally.  Gait normal with good balance and coordination.    MSK:  Non tender with full range of motion and good stability and symmetric strength and tone of shoulders, elbows, wrist, hip,  and ankles bilaterally.  Knee: Right Inspection revealed patient does have moderate osteoarthritic changes. Moderate joint line tenderness medial . ROM full in flexion and extension and lower leg rotation. Ligaments with solid consistent endpoints including ACL, PCL, LCL, MCL. Positive Mcmurray's, Apley's, and Thessalonian tests.  painful patellar compression. Patellar glide with moderate crepitus. Patellar and quadriceps tendons unremarkable. Hamstring and quadriceps strength is normal.  Contralateral knee has mild medial joint line tenderness as well as crepitus on range of motion.  MSK US performed of: Right This study was ordered, performed, and interpreted by Charlann Boxer D.O.  Knee: All structures visualized. Patient is a severe narrowing of the medial joint line. Patient does have trouble visualizing the meniscus secondary to the amount of narrowing. Mild osteophyte formation over the medial joint line. Patellar Tendon unremarkable on long and transverse views without effusion. No abnormality of prepatellar bursa. LCL and MCL unremarkable on long and transverse views. No abnormality of origin of medial or lateral head of the gastrocnemius.  IMPRESSION:  Moderate to severe osteoarthritis mostly of the medial compartment.   Impression and Recommendations:     This case required medical decision making of moderate complexity.

## 2014-05-25 NOTE — Telephone Encounter (Signed)
Sent to the pharmacy by e-scribe. 

## 2014-06-16 ENCOUNTER — Encounter: Payer: Self-pay | Admitting: Family Medicine

## 2014-06-16 ENCOUNTER — Ambulatory Visit (INDEPENDENT_AMBULATORY_CARE_PROVIDER_SITE_OTHER): Payer: Medicare Other | Admitting: Family Medicine

## 2014-06-16 VITALS — BP 140/78 | HR 93 | Ht 60.0 in | Wt 153.0 lb

## 2014-06-16 DIAGNOSIS — M1711 Unilateral primary osteoarthritis, right knee: Secondary | ICD-10-CM

## 2014-06-16 DIAGNOSIS — M1710 Unilateral primary osteoarthritis, unspecified knee: Secondary | ICD-10-CM

## 2014-06-16 NOTE — Assessment & Plan Note (Signed)
Patient is improving with conservative therapy for we did do an injection today. Patient marked out feeling significantly better. Patient is encouraged to increase the amount of Jazzercise again slowly over the course of the next 3-4 weeks. We discussed continuing the bracing for that amount of time as well and icing protocol. We discussed continuing over-the-counter medications in patient we'll do a trial of topical anti-inflammatories. Patient will follow up again with me in 3-4 weeks for further evaluation and treatment.  Spent greater than 25 minutes with patient face-to-face and had greater than 50% of counseling including as described above in assessment and plan.

## 2014-06-16 NOTE — Progress Notes (Signed)
  [facility] [place], [place] [address] Phone: [phone] Subjective:    CC: Knee pain followup  [id number] Cassandra Evans is a 80 y.o. female coming in with complaint of knee pain. Patient was seen previously for right knee pain and did have some osteoarthritis of the right knee. Patient did have x-rays social in the patient had only mild osteophytic changes. Patient states she is doing approximately 50% better. Patient to get the over-the-counter medications as well as been doing the icing and the exercises on a fairly regular basis. Patient would be more inclined not to do more of the Jazzercise she has not been able to do. Patient is Cassandra Evans from doing the bike and glasses. Denies any pain no with biking. Patient states with walking long distances she can have some discomfort but overall she does think she is improving.     Past medical history, social, surgical and family history all reviewed in electronic medical record.   Review of Systems: No headache, visual changes, nausea, vomiting, diarrhea, constipation, dizziness, abdominal pain, skin rash, fevers, chills, night sweats, weight loss, swollen lymph nodes, body aches, joint swelling, muscle aches, chest pain, shortness of breath, mood changes.   Objective Blood pressure 140/78, pulse 93, height 5' (1.524 m), weight 153 lb (69.4 kg), SpO2 96.00%.  General: No apparent distress alert and oriented x3 mood and affect normal, dressed appropriately.  HEENT: Pupils equal, extraocular movements intact  Respiratory: Patient's speak in full sentences and does not appear short of breath  Cardiovascular: No lower extremity edema, non tender, no erythema  Skin: Warm dry intact with no signs of infection or rash on extremities or on axial skeleton.  Abdomen: Soft nontender  Neuro: Cranial nerves II through XII are intact, neurovascularly intact in all extremities with 2+ DTRs and 2+ pulses.    Lymph: No lymphadenopathy of posterior or anterior cervical chain or axillae bilaterally.  Gait normal with good balance and coordination.  MSK:  Non tender with full range of motion and good stability and symmetric strength and tone of shoulders, elbows, wrist, hip,  and ankles bilaterally.  Knee: Right Inspection shows no swelling Mild joint line tenderness on the medial aspect it is improved. ROM full in flexion and extension and lower leg rotation. Ligaments with solid consistent endpoints including ACL, PCL, LCL, MCL. Negative Mcmurray's, Apley's, and Thessalonian tests.  painful patellar compression. Patellar glide with moderate crepitus. Patellar and quadriceps tendons unremarkable. Hamstring and quadriceps strength is normal.  Contralateral knee has mild medial joint line tenderness as well as crepitus on range of motion.  After informed written and verbal consent, patient was seated on exam table. Right knee was prepped with alcohol swab and utilizing anterolateral approach, patient's right knee space was injected with 4:1  marcaine 0.5%: Kenalog 40mg /dL. Patient tolerated the procedure well without immediate complications.   Impression and Recommendations:     This case required medical decision making of moderate complexity.

## 2014-06-16 NOTE — Patient Instructions (Signed)
Good to see you Ice still your best friend.  Wear brace with jazzercise.  Exercises 3 times a week.  Come back in 4 weeks.

## 2014-07-14 ENCOUNTER — Encounter: Payer: Self-pay | Admitting: Family Medicine

## 2014-07-14 ENCOUNTER — Ambulatory Visit (INDEPENDENT_AMBULATORY_CARE_PROVIDER_SITE_OTHER): Payer: Medicare Other | Admitting: Family Medicine

## 2014-07-14 VITALS — BP 122/82 | HR 95 | Temp 98.7°F | Wt 152.5 lb

## 2014-07-14 DIAGNOSIS — M1710 Unilateral primary osteoarthritis, unspecified knee: Secondary | ICD-10-CM

## 2014-07-14 DIAGNOSIS — M1711 Unilateral primary osteoarthritis, right knee: Secondary | ICD-10-CM

## 2014-07-14 NOTE — Progress Notes (Signed)
Pre visit review using our clinic review tool, if applicable. No additional management support is needed unless otherwise documented below in the visit note. 

## 2014-07-14 NOTE — Assessment & Plan Note (Signed)
Patient is doing remarkably well at this time. I do think there is a possibility a patient does have chronic degenerative changes of the meniscus as well that could be contributing. Patient likely will continue to improve with conservative therapy. Patient has any worsening pain or is completely resolved in the next 4-6 weeks patient will come back again for further evaluation and treatment. Patient was given a trial some topical anti-inflammatories and warned to use very minimal amount secondary to her history of chronic renal insufficiency. Also only doing a one time daily.

## 2014-07-14 NOTE — Patient Instructions (Signed)
It is great to see you as always.  Ice is still your friend. Especially after exercise.  Wear brace for another week for exercise and then try without.  Try pennsaid but only 1 times a day for your knee and only what fits on your pinky.  This is to avoid injurt to your kidneys Conitnue the tylenol and the turmeric.  We can repeat injection in 3 months if needed. Otherwise see me when you need me.

## 2014-07-14 NOTE — Progress Notes (Signed)
  [facility] [place], [place] [address] Phone: [phone] Subjective:    CC: Knee pain followup  [id number] Cassandra Evans is a 80 y.o. female coming in with complaint of knee pain. Patient was seen previously for right knee pain and did have some osteoarthritis of the right knee. Patient was responding fairly well to conservative therapy but did have a cortisone injection at last visit. Patient was to continue home exercises, icing protocol as well as bracing. Patient states she is approximately 95% better. Patient has been able to do all daily activities as well as started exercise classes again. Patient states that she has a dull nagging pain from time to time but overall none of the sharp pain that she was feeling previously. Still wears the brace with exercise which has been helpful. Has not been icing and is not taking any medications.      Past medical history, social, surgical and family history all reviewed in electronic medical record.   Review of Systems: No headache, visual changes, nausea, vomiting, diarrhea, constipation, dizziness, abdominal pain, skin rash, fevers, chills, night sweats, weight loss, swollen lymph nodes, body aches, joint swelling, muscle aches, chest pain, shortness of breath, mood changes.   Objective Blood pressure 122/82, pulse 95, temperature 98.7 F (37.1 C), temperature source Oral, weight 152 lb 8 oz (69.174 kg), SpO2 96.00%.  General: No apparent distress alert and oriented x3 mood and affect normal, dressed appropriately.  HEENT: Pupils equal, extraocular movements intact  Respiratory: Patient's speak in full sentences and does not appear short of breath  Cardiovascular: No lower extremity edema, non tender, no erythema  Skin: Warm dry intact with no signs of infection or rash on extremities or on axial skeleton.  Abdomen: Soft nontender  Neuro: Cranial nerves II through XII are intact,  neurovascularly intact in all extremities with 2+ DTRs and 2+ pulses.  Lymph: No lymphadenopathy of posterior or anterior cervical chain or axillae bilaterally.  Gait normal with good balance and coordination.  MSK:  Non tender with full range of motion and good stability and symmetric strength and tone of shoulders, elbows, wrist, hip,  and ankles bilaterally.  Knee: Right Inspection shows no swelling Mild joint line tenderness on the medial aspect continues ROM full in flexion and extension and lower leg rotation. Ligaments with solid consistent endpoints including ACL, PCL, LCL, MCL. Negative Mcmurray's, Apley's, and Thessalonian tests. Nonpainful patellar compression Patellar glide with moderate crepitus. Patellar and quadriceps tendons unremarkable. Hamstring and quadriceps strength is normal.  Contralateral knee has mild medial joint line tenderness as well as crepitus on range of motion.   Impression and Recommendations:     This case required medical decision making of moderate complexity.

## 2014-08-08 ENCOUNTER — Other Ambulatory Visit: Payer: Self-pay | Admitting: Internal Medicine

## 2014-08-08 NOTE — Telephone Encounter (Signed)
Sent to the pharmacy by e-scribe.  Pt has upcoming appt on 10/25/14

## 2014-08-29 LAB — HM MAMMOGRAPHY

## 2014-08-30 ENCOUNTER — Encounter: Payer: Self-pay | Admitting: Internal Medicine

## 2014-09-07 ENCOUNTER — Encounter: Payer: Self-pay | Admitting: Internal Medicine

## 2014-10-17 ENCOUNTER — Other Ambulatory Visit: Payer: Self-pay | Admitting: Family Medicine

## 2014-10-17 DIAGNOSIS — N183 Chronic kidney disease, stage 3 unspecified: Secondary | ICD-10-CM

## 2014-10-17 DIAGNOSIS — E1122 Type 2 diabetes mellitus with diabetic chronic kidney disease: Secondary | ICD-10-CM

## 2014-10-17 LAB — HM DIABETES EYE EXAM

## 2014-10-18 ENCOUNTER — Other Ambulatory Visit (INDEPENDENT_AMBULATORY_CARE_PROVIDER_SITE_OTHER): Payer: Medicare Other

## 2014-10-18 DIAGNOSIS — E1122 Type 2 diabetes mellitus with diabetic chronic kidney disease: Secondary | ICD-10-CM

## 2014-10-18 DIAGNOSIS — N183 Chronic kidney disease, stage 3 unspecified: Secondary | ICD-10-CM

## 2014-10-18 DIAGNOSIS — N189 Chronic kidney disease, unspecified: Secondary | ICD-10-CM

## 2014-10-18 LAB — BASIC METABOLIC PANEL
BUN: 49 mg/dL — ABNORMAL HIGH (ref 6–23)
CO2: 24 mEq/L (ref 19–32)
Calcium: 9.5 mg/dL (ref 8.4–10.5)
Chloride: 108 mEq/L (ref 96–112)
Creatinine, Ser: 1.7 mg/dL — ABNORMAL HIGH (ref 0.4–1.2)
GFR: 31.61 mL/min — AB (ref >60.00)
Glucose, Bld: 141 mg/dL — ABNORMAL HIGH (ref 70–99)
Potassium: 5.9 mEq/L — ABNORMAL HIGH (ref 3.5–5.1)
Sodium: 138 mEq/L (ref 135–145)

## 2014-10-18 LAB — HEMOGLOBIN A1C: Hgb A1c MFr Bld: 7 % — ABNORMAL HIGH (ref 4.6–6.5)

## 2014-10-19 ENCOUNTER — Encounter: Payer: Self-pay | Admitting: Family Medicine

## 2014-10-19 ENCOUNTER — Telehealth: Payer: Self-pay | Admitting: Family Medicine

## 2014-10-19 NOTE — Telephone Encounter (Signed)
Patient denied taking potassium supplements.  Has made an appt for 10/21/14 for lab work.  Instructed her to come well hydrated.

## 2014-10-19 NOTE — Telephone Encounter (Signed)
Burnis Medin, MD  [place]           Make sure not taking potassium level is up possibly.  Have her repeat potassium when well hydrated   ( before her visit with me)  [facility]

## 2014-10-21 ENCOUNTER — Other Ambulatory Visit (INDEPENDENT_AMBULATORY_CARE_PROVIDER_SITE_OTHER): Payer: Medicare Other

## 2014-10-21 DIAGNOSIS — I10 Essential (primary) hypertension: Secondary | ICD-10-CM

## 2014-10-21 LAB — BASIC METABOLIC PANEL
BUN: 46 mg/dL — ABNORMAL HIGH (ref 6–23)
CO2: 22 mEq/L (ref 19–32)
Calcium: 9.5 mg/dL (ref 8.4–10.5)
Chloride: 107 mEq/L (ref 96–112)
Creatinine, Ser: 1.4 mg/dL — ABNORMAL HIGH (ref 0.4–1.2)
GFR: 37.85 mL/min — ABNORMAL LOW (ref >60.00)
Glucose, Bld: 119 mg/dL — ABNORMAL HIGH (ref 70–99)
POTASSIUM: 4.7 meq/L (ref 3.5–5.1)
SODIUM: 136 meq/L (ref 135–145)

## 2014-10-25 ENCOUNTER — Ambulatory Visit (INDEPENDENT_AMBULATORY_CARE_PROVIDER_SITE_OTHER): Payer: Medicare Other | Admitting: Internal Medicine

## 2014-10-25 ENCOUNTER — Encounter: Payer: Self-pay | Admitting: Internal Medicine

## 2014-10-25 VITALS — BP 124/56 | Temp 97.4°F | Ht 60.25 in | Wt 152.5 lb

## 2014-10-25 DIAGNOSIS — E118 Type 2 diabetes mellitus with unspecified complications: Secondary | ICD-10-CM

## 2014-10-25 DIAGNOSIS — D631 Anemia in chronic kidney disease: Secondary | ICD-10-CM

## 2014-10-25 DIAGNOSIS — N183 Chronic kidney disease, stage 3 unspecified: Secondary | ICD-10-CM

## 2014-10-25 DIAGNOSIS — N189 Chronic kidney disease, unspecified: Secondary | ICD-10-CM

## 2014-10-25 DIAGNOSIS — M79642 Pain in left hand: Secondary | ICD-10-CM | POA: Insufficient documentation

## 2014-10-25 DIAGNOSIS — I10 Essential (primary) hypertension: Secondary | ICD-10-CM

## 2014-10-25 NOTE — Patient Instructions (Signed)
Ok to go back to every other day Lasix.  Hand area could be arthitis   . Tylenol  We can get x ray .  Or hand specialist .   Plan  wellness visit in 6 months  We can do labs at that visit.

## 2014-10-25 NOTE — Progress Notes (Signed)
Pre visit review using our clinic review tool, if applicable. No additional management support is needed unless otherwise documented below in the visit note.  Chief Complaint  Patient presents with  . Follow-up    Dm HT     HPI: Cassandra Evans and 80 y.o.  Yo  Chronic disease management  Since last visit : Had knee injection   Steroid and sugar was  in 200s for a week.  .   But better now no lows  Or other sx.  Other stresses   .   Thought lasix causes  Diarrhea  And using qod lasix.  Went to know if should go back daily we repeated her potassium level that was up and it came back down. Vision cataracts   Last year  Doing okay with this. ROS: See pertinent positives and negatives per HPI.no current chest pain shortness of breath. Left base of thumb sometimes hurts wonders if it's arthritis  Past Medical History  Diagnosis Date  . Diabetes mellitus   . Hyperlipidemia   . Hypertension   . Osteoporosis   . Bladder cancer     more than 5 years out    Family History  Problem Relation Age of Onset  . Leukemia Mother   . Heart attack Other     History   Social History  . Marital Status: Married    Spouse Name: N/A    Number of Children: N/A  . Years of Education: N/A   Social History Main Topics  . Smoking status: Never Smoker   . Smokeless tobacco: None  . Alcohol Use: None  . Drug Use: None  . Sexual Activity: None   Other Topics Concern  . None   Social History Narrative   Widowed   Retired [employer]   From [place] originally   [place] ;moved to a new place i after husbands death   Exercises low impact zumba    Outpatient Encounter Prescriptions as of 10/25/2014  Medication Sig  . acetaminophen (TYLENOL) 325 MG tablet Take 650 mg by mouth 3 (three) times daily.  Marland Kitchen aspirin 81 MG chewable tablet Chew 81 mg by mouth daily.    . B-D ULTRAFINE III SHORT PEN 31G X 8 MM MISC USE 1 NEEDLE DAILY  . furosemide (LASIX) 20 MG tablet Take 10 mg by  mouth daily.  Marland Kitchen GLIPIZIDE XL 5 MG 24 hr tablet TAKE 1 TABLET DAILY  . glucose blood (FREESTYLE TEST STRIPS) test strip 1 each by Other route 4 (four) times daily. Use as instructed   . JANUVIA 50 MG tablet TAKE 1 TABLET DAILY  . Lancets (FREESTYLE) lancets 1 each by Other route 4 (four) times daily. Use as instructed   . LANTUS SOLOSTAR 100 UNIT/ML Solostar Pen INJECT 6 TO 8 UNITS AT BEDTIME  . Melatonin 3 MG TABS Take 1 tablet by mouth at bedtime.  . MULTIPLE VITAMIN PO Take by mouth.    Marland Kitchen olopatadine (PATANOL) 0.1 % ophthalmic solution Place 1 drop into both eyes 2 (two) times daily.   Marland Kitchen OVER THE COUNTER MEDICATION Tumeric twice daily.  . pioglitazone (ACTOS) 30 MG tablet TAKE 1 TABLET DAILY  . simvastatin (ZOCOR) 40 MG tablet TAKE 1 TABLET AT BEDTIME  . valsartan (DIOVAN) 320 MG tablet Take half a tablet daily  . VITAMIN D, ERGOCALCIFEROL, PO Take by mouth.     EXAM:  BP 124/56 mmHg  Temp(Src) 97.4 F (36.3 C) (Oral)  Ht 5' 0.25" (1.53 m)  Wt 152 lb 8 oz (69.174 kg)  BMI 29.55 kg/m2  Body mass index is 29.55 kg/(m^2).  GENERAL: vitals reviewed and listed above, alert, oriented, appears well hydrated and in no acute distress HEENT: atraumatic, conjunctiva  clear, no obvious abnormalities on inspection of external nose and ears NECK: no obvious masses on inspection palpation  LUNGS: clear to auscultation bilaterally, no wheezes, rales or rhonchi, good air movement Abdomen soft without organomegaly guarding or rebound CV: HRRR, no clubbing cyanosis or  peripheral edema nl cap refill  MS: moves all extremities without noticeable focal  abnormalityleft thenar eminence slightly prominent no redness or swelling otherwise base of thumb slightly tender PSYCH: pleasant and cooperative, no obvious depression or anxiety Good balance. Lab Results  Component Value Date   WBC 5.2 05/16/2014   HGB 11.7* 05/16/2014   HCT 34.8* 05/16/2014   PLT 194.0 05/16/2014   GLUCOSE 119* 10/21/2014    CHOL 203* 05/16/2014   TRIG 207.0* 05/16/2014   HDL 85.50 05/16/2014   LDLDIRECT 83.5 11/25/2013   LDLCALC 76 05/16/2014   ALT 27 05/16/2014   AST 26 05/16/2014   NA 136 10/21/2014   K 4.7 10/21/2014   CL 107 10/21/2014   CREATININE 1.4* 10/21/2014   BUN 46* 10/21/2014   CO2 22 10/21/2014   TSH 1.59 05/16/2014   HGBA1C 7.0* 10/18/2014   MICROALBUR 3.0* 09/06/2011   Lab Results  Component Value Date   HGBA1C 7.0* 10/18/2014   HGBA1C 7.3* 05/16/2014   HGBA1C 6.9* 11/25/2013   Lab Results  Component Value Date   MICROALBUR 3.0* 09/06/2011   LDLCALC 76 05/16/2014   CREATININE 1.4* 10/21/2014  reviwed dr Posey Pronto note  ASSESSMENT AND PLAN:  Discussed the following assessment and plan:  Diabetes mellitus type 2, controlled, with complications - controlled doing well creatinine 1.4  Chronic kidney disease, stage III (moderate) - cr 1.4 range  Essential hypertension  Anemia of renal disease - the same  -Patient advised to return or notify health care team  if symptoms worsen ,persist or new concerns arise.  Patient Instructions  Ok to go back to every other day Lasix.  Hand area could be arthitis   . Tylenol  We can get x ray .  Or hand specialist .   Plan  wellness visit in 6 months  We can do labs at that visit.       Standley Brooking. Deion Forgue M.D. Total visit 46mins > 50% spent counseling and coordinating care

## 2014-10-26 ENCOUNTER — Other Ambulatory Visit: Payer: Self-pay | Admitting: Family Medicine

## 2014-10-26 ENCOUNTER — Other Ambulatory Visit: Payer: Self-pay | Admitting: Internal Medicine

## 2014-10-26 ENCOUNTER — Telehealth: Payer: Self-pay | Admitting: Family Medicine

## 2014-10-26 DIAGNOSIS — N289 Disorder of kidney and ureter, unspecified: Secondary | ICD-10-CM

## 2014-10-26 DIAGNOSIS — I10 Essential (primary) hypertension: Secondary | ICD-10-CM

## 2014-10-26 DIAGNOSIS — E118 Type 2 diabetes mellitus with unspecified complications: Secondary | ICD-10-CM

## 2014-10-26 DIAGNOSIS — D649 Anemia, unspecified: Secondary | ICD-10-CM

## 2014-10-26 DIAGNOSIS — E785 Hyperlipidemia, unspecified: Secondary | ICD-10-CM

## 2014-10-26 NOTE — Telephone Encounter (Signed)
-----   Message from Burnis Medin, MD sent at 10/25/2014  5:33 PM EST ----- Regarding: RE: Lab Order Hyperlipidemia ,  ----- Message -----    From: Hulda Humphrey, CMA    Sent: 10/25/2014   2:22 PM      To: Burnis Medin, MD Subject: Melton Alar: Lab Order                                  What code for the TSH?  ----- Message -----    From: Vaughan Browner    Sent: 10/25/2014   1:44 PM      To: Hulda Humphrey, CMA Subject: Lab Order                                      Addalee Kavanagh,  I just checked Ms. Mermelstein out and scheduled her preventative visit and lab per the instructions on her AVS.  Since, she is a medicare patient we need orders entered so the lab staff does not associate a V70.0(preventative) dx with the labs as Medicare will not pay.    Will you please enter orders for the lipid, bmp, A1C, tsh and cbc w/diff that Dr. Regis Bill ordered with the appropriate diagnostic code?  Thanks, Derinda Late

## 2014-10-26 NOTE — Telephone Encounter (Signed)
Orders placed in the system.

## 2014-10-26 NOTE — Telephone Encounter (Signed)
Sent to the pharmacy by e-scribe. 

## 2014-10-27 ENCOUNTER — Telehealth: Payer: Self-pay | Admitting: Internal Medicine

## 2014-10-27 MED ORDER — SIMVASTATIN 40 MG PO TABS: ORAL_TABLET | ORAL | Status: DC

## 2014-10-27 NOTE — Telephone Encounter (Signed)
[facility] is requesting re-fill on simvastatin (ZOCOR) 40 MG tablet

## 2014-10-27 NOTE — Telephone Encounter (Signed)
Sent to the pharmacy by e-scribe. 

## 2015-01-18 ENCOUNTER — Other Ambulatory Visit: Payer: Self-pay | Admitting: Internal Medicine

## 2015-01-18 NOTE — Telephone Encounter (Signed)
Sent to the pharmacy by e-scribe. 

## 2015-03-02 ENCOUNTER — Encounter: Payer: Self-pay | Admitting: Family Medicine

## 2015-03-02 ENCOUNTER — Ambulatory Visit (INDEPENDENT_AMBULATORY_CARE_PROVIDER_SITE_OTHER): Payer: Medicare Other | Admitting: Family Medicine

## 2015-03-02 DIAGNOSIS — J029 Acute pharyngitis, unspecified: Secondary | ICD-10-CM | POA: Diagnosis not present

## 2015-03-02 LAB — POCT RAPID STREP A (OFFICE): Rapid Strep A Screen: NEGATIVE

## 2015-03-02 NOTE — Progress Notes (Signed)
Pre visit review using our clinic review tool, if applicable. No additional management support is needed unless otherwise documented below in the visit note. 

## 2015-03-02 NOTE — Patient Instructions (Signed)

## 2015-03-02 NOTE — Progress Notes (Signed)
   Subjective:    Patient ID: Cassandra Evans, female    DOB: September 11, 1933, 81 y.o.   MRN: [id number]  HPI Acute visit. Patient seen with sore throat. Onset Sunday. Also developed some rhinorrhea. She's had mild body aches. No fever. No sick exposures. Denies any nausea, vomiting, or diarrhea. Using Tylenol with some relief. Nonsmoker.  Past Medical History  Diagnosis Date  . Diabetes mellitus   . Hyperlipidemia   . Hypertension   . Osteoporosis   . Bladder cancer     more than 5 years out   Past Surgical History  Procedure Laterality Date  . Abdominal hysterectomy    . Cystectomy    . Incontinent urinary diversion    . Eye surgery    . Breast surgery      reports that she has never smoked. She does not have any smokeless tobacco history on file. Her alcohol and drug histories are not on file. family history includes Heart attack in her other; Leukemia in her mother. No Known Allergies    Review of Systems  Constitutional: Positive for fatigue. Negative for fever and chills.  HENT: Positive for congestion and sore throat.   Respiratory: Negative for cough.   Neurological: Positive for headaches.       Objective:   Physical Exam  Constitutional: She appears well-developed and well-nourished.  HENT:  Right Ear: External ear normal.  Left Ear: External ear normal.  Mild posterior pharynx erythema. She has a couple of very small aphthous ulcers around the uvula. No exudate  Neck: Neck supple.  Minimal anterior cervical adenopathy  Cardiovascular: Normal rate and regular rhythm.   Pulmonary/Chest: Effort normal and breath sounds normal. No respiratory distress. She has no wheezes. She has no rales.          Assessment & Plan:  Pharyngitis. Suspect viral. Rapid strep negative. Treat symptomatically. Follow-up as needed.

## 2015-04-09 ENCOUNTER — Other Ambulatory Visit: Payer: Self-pay | Admitting: Internal Medicine

## 2015-04-11 ENCOUNTER — Telehealth: Payer: Self-pay

## 2015-04-11 MED ORDER — GLIPIZIDE ER 5 MG PO TB24: 5.0000 mg | ORAL_TABLET | Freq: Every day | ORAL | Status: DC

## 2015-04-11 MED ORDER — PIOGLITAZONE HCL 30 MG PO TABS: 30.0000 mg | ORAL_TABLET | Freq: Every day | ORAL | Status: DC

## 2015-04-11 MED ORDER — SITAGLIPTIN PHOSPHATE 50 MG PO TABS: 50.0000 mg | ORAL_TABLET | Freq: Every day | ORAL | Status: DC

## 2015-04-11 MED ORDER — SIMVASTATIN 40 MG PO TABS: 40.0000 mg | ORAL_TABLET | Freq: Every day | ORAL | Status: DC

## 2015-04-11 NOTE — Telephone Encounter (Signed)
Pt called and left a voicemail stating that she test her blood sugar twice daily.

## 2015-04-11 NOTE — Addendum Note (Signed)
Addended by: Santiago Bumpers on: 04/11/2015 09:56 AM   Modules accepted: Orders

## 2015-04-11 NOTE — Addendum Note (Signed)
Addended by: Santiago Bumpers on: 04/11/2015 09:57 AM   Modules accepted: Orders

## 2015-04-11 NOTE — Addendum Note (Signed)
Addended by: Santiago Bumpers on: 04/11/2015 09:58 AM   Modules accepted: Orders

## 2015-04-12 ENCOUNTER — Other Ambulatory Visit: Payer: Self-pay | Admitting: Family Medicine

## 2015-04-12 MED ORDER — PIOGLITAZONE HCL 30 MG PO TABS: 30.0000 mg | ORAL_TABLET | Freq: Every day | ORAL | Status: AC

## 2015-04-12 MED ORDER — GLIPIZIDE ER 5 MG PO TB24: 5.0000 mg | ORAL_TABLET | Freq: Every day | ORAL | Status: AC

## 2015-04-12 MED ORDER — SITAGLIPTIN PHOSPHATE 50 MG PO TABS: 50.0000 mg | ORAL_TABLET | Freq: Every day | ORAL | Status: AC

## 2015-04-12 MED ORDER — SIMVASTATIN 40 MG PO TABS: 40.0000 mg | ORAL_TABLET | Freq: Every day | ORAL | Status: AC

## 2015-04-12 NOTE — Telephone Encounter (Signed)
Left a message on home machine informing the pt that her paper work has been faxed.  Received a transmission sheet stating the fax was successful.

## 2015-04-12 NOTE — Telephone Encounter (Signed)
Pt has not had lab work that was ordered June 2015.  Please advise.  Thanks!

## 2015-04-12 NOTE — Telephone Encounter (Signed)
Sent to the pharmacy by e-scribe. 

## 2015-04-12 NOTE — Telephone Encounter (Signed)
Ok to refill x 6 months  She has appt in the next few weeks

## 2015-04-17 ENCOUNTER — Other Ambulatory Visit: Payer: Medicare Other

## 2015-04-18 ENCOUNTER — Other Ambulatory Visit (INDEPENDENT_AMBULATORY_CARE_PROVIDER_SITE_OTHER): Payer: Medicare Other

## 2015-04-18 DIAGNOSIS — D649 Anemia, unspecified: Secondary | ICD-10-CM

## 2015-04-18 DIAGNOSIS — E785 Hyperlipidemia, unspecified: Secondary | ICD-10-CM | POA: Diagnosis not present

## 2015-04-18 DIAGNOSIS — N289 Disorder of kidney and ureter, unspecified: Secondary | ICD-10-CM | POA: Diagnosis not present

## 2015-04-18 DIAGNOSIS — E118 Type 2 diabetes mellitus with unspecified complications: Secondary | ICD-10-CM

## 2015-04-18 DIAGNOSIS — I10 Essential (primary) hypertension: Secondary | ICD-10-CM | POA: Diagnosis not present

## 2015-04-18 LAB — CBC WITH DIFFERENTIAL/PLATELET
Basophils Absolute: 0 10*3/uL (ref 0.0–0.1)
Basophils Relative: 0.5 % (ref 0.0–3.0)
EOS PCT: 1.8 % (ref 0.0–5.0)
Eosinophils Absolute: 0.1 10*3/uL (ref 0.0–0.7)
HEMATOCRIT: 33.5 % — AB (ref 36.0–46.0)
Hemoglobin: 11.4 g/dL — ABNORMAL LOW (ref 12.0–15.0)
LYMPHS ABS: 1 10*3/uL (ref 0.7–4.0)
Lymphocytes Relative: 23.9 % (ref 12.0–46.0)
MCHC: 33.9 g/dL (ref 30.0–36.0)
MCV: 90.9 fl (ref 78.0–100.0)
MONO ABS: 0.6 10*3/uL (ref 0.1–1.0)
MONOS PCT: 13.4 % — AB (ref 3.0–12.0)
NEUTROS ABS: 2.5 10*3/uL (ref 1.4–7.7)
Neutrophils Relative %: 60.4 % (ref 43.0–77.0)
Platelets: 194 10*3/uL (ref 150.0–400.0)
RBC: 3.69 Mil/uL — AB (ref 3.87–5.11)
RDW: 14.5 % (ref 11.5–15.5)
WBC: 4.2 10*3/uL (ref 4.0–10.5)

## 2015-04-18 LAB — LIPID PANEL
Cholesterol: 195 mg/dL (ref 0–200)
HDL: 76.7 mg/dL (ref >39.00)
LDL CALC: 90 mg/dL (ref 0–99)
NONHDL: 118.3
Total CHOL/HDL Ratio: 3
Triglycerides: 144 mg/dL (ref 0.0–149.0)
VLDL: 28.8 mg/dL (ref 0.0–40.0)

## 2015-04-18 LAB — HEMOGLOBIN A1C: Hgb A1c MFr Bld: 6.9 % — ABNORMAL HIGH (ref 4.6–6.5)

## 2015-04-18 LAB — BASIC METABOLIC PANEL
BUN: 68 mg/dL — AB (ref 6–23)
CO2: 19 mEq/L (ref 19–32)
CREATININE: 1.88 mg/dL — AB (ref 0.40–1.20)
Calcium: 9.4 mg/dL (ref 8.4–10.5)
Chloride: 109 mEq/L (ref 96–112)
GFR: 27.34 mL/min — AB (ref >60.00)
Glucose, Bld: 115 mg/dL — ABNORMAL HIGH (ref 70–99)
POTASSIUM: 5.1 meq/L (ref 3.5–5.1)
Sodium: 135 mEq/L (ref 135–145)

## 2015-04-18 LAB — TSH: TSH: 0.99 u[IU]/mL (ref 0.35–4.50)

## 2015-04-24 ENCOUNTER — Encounter: Payer: Self-pay | Admitting: Internal Medicine

## 2015-04-24 ENCOUNTER — Ambulatory Visit (INDEPENDENT_AMBULATORY_CARE_PROVIDER_SITE_OTHER): Payer: Medicare Other | Admitting: Internal Medicine

## 2015-04-24 VITALS — BP 126/50 | Temp 98.1°F | Ht 60.0 in | Wt 151.8 lb

## 2015-04-24 DIAGNOSIS — Z9889 Other specified postprocedural states: Secondary | ICD-10-CM

## 2015-04-24 DIAGNOSIS — E2839 Other primary ovarian failure: Secondary | ICD-10-CM

## 2015-04-24 DIAGNOSIS — N189 Chronic kidney disease, unspecified: Secondary | ICD-10-CM

## 2015-04-24 DIAGNOSIS — D631 Anemia in chronic kidney disease: Secondary | ICD-10-CM

## 2015-04-24 DIAGNOSIS — Z79899 Other long term (current) drug therapy: Secondary | ICD-10-CM

## 2015-04-24 DIAGNOSIS — N183 Chronic kidney disease, stage 3 unspecified: Secondary | ICD-10-CM

## 2015-04-24 DIAGNOSIS — E118 Type 2 diabetes mellitus with unspecified complications: Secondary | ICD-10-CM

## 2015-04-24 DIAGNOSIS — I129 Hypertensive chronic kidney disease with stage 1 through stage 4 chronic kidney disease, or unspecified chronic kidney disease: Secondary | ICD-10-CM | POA: Diagnosis not present

## 2015-04-24 DIAGNOSIS — H6123 Impacted cerumen, bilateral: Secondary | ICD-10-CM

## 2015-04-24 DIAGNOSIS — Z0000 Encounter for general adult medical examination without abnormal findings: Secondary | ICD-10-CM

## 2015-04-24 NOTE — Progress Notes (Signed)
Pre visit review using our clinic review tool, if applicable. No additional management support is needed unless otherwise documented below in the visit note.  Chief Complaint  Patient presents with  . Medicare Wellness    cdm  . Diabetes    HPI: Cassandra Evans 81 y.o. comes in today for Preventive Medicare wellness visit . And Chronic disease management   GQQ:PYPPJ last visit  Has been placed by Dr Posey Pronto  on new anti potassium med Valtassa 1 packet per day for the last few weeks  For potassium control last cr 1.43 pot 5.6  DM:  Noted some jumps in BG but generally ok  No low s.  Had bg log. No lows   Challenging to balance low K and diabetic healthy diet.   Will be moving to [place] closer to family  Soon doing ok with this is healthy activity wise and independent  Bp stable ? If low no syncope  Health Maintenance  Topic Date Due  . COLONOSCOPY  06/20/2013  . INFLUENZA VACCINE  07/17/2015  . MAMMOGRAM  08/30/2015  . OPHTHALMOLOGY EXAM  10/18/2015  . HEMOGLOBIN A1C  10/19/2015  . FOOT EXAM  04/23/2016  . TETANUS/TDAP  09/05/2021  . DEXA SCAN  Completed  . ZOSTAVAX  Completed  . PNA vac Low Risk Adult  Completed   Health Maintenance Review LIFESTYLE:  Exercise:  jazzercise   Tobacco/ETS:no Alcohol: per day  3 x per week  Sugar beverages: no Sleep: about 8 hours    Moving to [place]   To be part of new grandchild  .  Condo.   Drug use: no Colonoscopy:  utd  .   MEDICARE DOCUMENT QUESTIONS  TO SCAN   Hearing: hearing aids  Ears flushed   Vision:  No limitations at present . Last eye check UTDto have  Visit soon .  Safety:  Has smoke detector and wears seat belts.  No firearms. No excess sun exposure. Sees dentist regularly.  Falls: no  Advance directive :  Reviewed  Has one.  Memory: Felt to be good  , no concern from her or her family. Not as good as it was. Name finding som e slowness .  Depression: No anhedonia unusual crying or depressive  symptoms  Nutrition: Eats well balanced diet; adequate calcium and vitamin D. No swallowing chewing problems.  Injury: no major injuries in the last six months.  Other healthcare providers:  Reviewed today .  Social:  Lives  Alone  Travels good social network to move to [place] wher son lives   Preventive parameters: up-to-date  Reviewed   ADLS:   There are no problems or need for assistance  driving, feeding, obtaining food, dressing, toileting and bathing, managing money using phone. She is independent.   ROS:  GEN/ HEENT: No fever, significant weight changes sweats headaches vision problems hearing changes, CV/ PULM; No chest pain shortness of breath cough, syncope,edema  change in exercise tolerance. GI /GU: No adominal pain, vomiting, change in bowel habits. No blood in the stool. No significant GU symptoms. SKIN/HEME: ,no acute skin rashes suspicious lesions or bleeding. No lymphadenopathy, nodules, masses.  NEURO/ PSYCH:  No neurologic signs such as weakness numbness. No depression anxiety. IMM/ Allergy: No unusual infections.  Allergy .   REST of 12 system review negative except as per HPI   Past Medical History  Diagnosis Date  . Diabetes mellitus   . Hyperlipidemia   . Hypertension   . Osteoporosis   .  Bladder cancer     more than 5 years out    Family History  Problem Relation Age of Onset  . Leukemia Mother   . Heart attack Other     History   Social History  . Marital Status: Married    Spouse Name: N/A  . Number of Children: N/A  . Years of Education: N/A   Social History Main Topics  . Smoking status: Never Smoker   . Smokeless tobacco: Not on file  . Alcohol Use: Not on file  . Drug Use: Not on file  . Sexual Activity: Not on file   Other Topics Concern  . None   Social History Narrative   Widowed   Retired [employer]   From [place] originally   [place] ;moved to a new place i after husbands death   Exercises low impact zumba     Outpatient Encounter Prescriptions as of 04/24/2015  Medication Sig  . acetaminophen (TYLENOL) 325 MG tablet Take 650 mg by mouth 3 (three) times daily.  Marland Kitchen aspirin 81 MG chewable tablet Chew 81 mg by mouth daily.    . B-D ULTRAFINE III SHORT PEN 31G X 8 MM MISC USE 1 NEEDLE DAILY  . DIOVAN 320 MG tablet TAKE 1 TABLET DAILY  . furosemide (LASIX) 20 MG tablet Take 10 mg by mouth daily.  Marland Kitchen glipiZIDE (GLIPIZIDE XL) 5 MG 24 hr tablet Take 1 tablet (5 mg total) by mouth daily.  Marland Kitchen glucose blood (FREESTYLE TEST STRIPS) test strip 1 each by Other route 4 (four) times daily. Use as instructed   . Lancets (FREESTYLE) lancets 1 each by Other route 4 (four) times daily. Use as instructed   . LANTUS SOLOSTAR 100 UNIT/ML Solostar Pen INJECT 6 TO 8 UNITS AT BEDTIME (Patient taking differently: INJECT 8 TO 10 UNITS AT BEDTIME)  . Melatonin 3 MG TABS Take 1 tablet by mouth at bedtime.  . MULTIPLE VITAMIN PO Take by mouth.    Marland Kitchen olopatadine (PATANOL) 0.1 % ophthalmic solution Place 1 drop into both eyes 2 (two) times daily.   Marland Kitchen OVER THE COUNTER MEDICATION Tumeric twice daily.  . patiromer (VELTASSA) 8.4 G packet Take 1 packet (8.4 g total) by mouth daily. Per Dr Posey Pronto  . pioglitazone (ACTOS) 30 MG tablet Take 1 tablet (30 mg total) by mouth daily.  . simvastatin (ZOCOR) 40 MG tablet Take 1 tablet (40 mg total) by mouth at bedtime.  . sitaGLIPtin (JANUVIA) 50 MG tablet Take 1 tablet (50 mg total) by mouth daily.  Marland Kitchen VITAMIN D, ERGOCALCIFEROL, PO Take by mouth.   . [DISCONTINUED] valsartan (DIOVAN) 320 MG tablet Take half a tablet daily   No facility-administered encounter medications on file as of 04/24/2015.    EXAM:  BP 126/50 mmHg  Temp(Src) 98.1 F (36.7 C) (Oral)  Ht 5' (1.524 m)  Wt 151 lb 12.8 oz (68.856 kg)  BMI 29.65 kg/m2  Body mass index is 29.65 kg/(m^2).  Physical Exam: Vital signs reviewed TSV:XBLT is a well-developed well-nourished alert cooperative   who appears younger than stated  age in no acute distress.  HEENT: normocephalic atraumatic , Eyes: PERRL EOM's full, conjunctiva clear, glasses Nares: paten,t no deformity discharge or tenderness., Ears: no deformity EAC's clear TMs with normal landmarks.hearing aids  Mouth: clear OP, no lesions, edema.  Moist mucous membranes. Dentition in adequate repair. NECK: supple without masses, thyromegaly or bruits. CHEST/PULM:  Clear to auscultation and percussion breath sounds equal no wheeze , rales  or rhonchi. No chest wall deformities or tenderness. CV: PMI is nondisplaced, S1 S2 no gallops, murmurs, rubs. Peripheral pulses are full without delay.No JVD .  Breast: normal by inspection . No dimpling, discharge, masses, tenderness or discharge . ABDOMEN: Bowel sounds normal nontender  No guard or rebound, no hepato splenomegal no CVA tenderness diversion  Bag in place . Extremtities:  No clubbing cyanosis or edema, no acute joint swelling or redness no focal atrophy NEURO:  Oriented x3, cranial nerves 3-12 appear to be intact, no obvious focal weakness,gait within normal limits no abnormal reflexes or asymmetrical SKIN: No acute rashes normal turgor, color, no bruising or petechiae. PSYCH: Oriented, good eye contact, no obvious depression anxiety, cognition and judgment appear normal. LN: no cervical axillary inguinal adenopathy No noted deficits in memory, attention, and speech.  Diabetic Foot Exam - Simple   Simple Foot Form  Diabetic Foot exam was performed with the following findings:  Yes 04/24/2015 12:20 PM  Visual Inspection  No deformities, no ulcerations, no other skin breakdown bilaterally:  Yes  Sensation Testing  Intact to touch and monofilament testing bilaterally:  Yes  Pulse Check  Posterior Tibialis and Dorsalis pulse intact bilaterally:  Yes  Comments       Lab Results  Component Value Date   WBC 4.2 04/18/2015   HGB 11.4* 04/18/2015   HCT 33.5* 04/18/2015   PLT 194.0 04/18/2015   GLUCOSE 115* 04/18/2015    CHOL 195 04/18/2015   TRIG 144.0 04/18/2015   HDL 76.70 04/18/2015   LDLDIRECT 83.5 11/25/2013   LDLCALC 90 04/18/2015   ALT 27 05/16/2014   AST 26 05/16/2014   NA 135 04/18/2015   K 5.1 04/18/2015   CL 109 04/18/2015   CREATININE 1.88* 04/18/2015   BUN 68* 04/18/2015   CO2 19 04/18/2015   TSH 0.99 04/18/2015   HGBA1C 6.9* 04/18/2015   MICROALBUR 3.0* 09/06/2011    ASSESSMENT AND PLAN:  Discussed the following assessment and plan:  Medicare annual wellness visit, subsequent  Chronic kidney disease, stage III (moderate) - followed dr Posey Pronto stable on  potassium reducing agent - Plan: DG Bone Density  Diabetes mellitus type 2, controlled, with complications - no low regimen may be ablt to be simplified but going to move  would leave that to future provider diabetes managment   Hypertension, renal, stage 1-4 or unspecified chronic kidney disease  Anemia of renal disease  Medication management  Cerumen impaction, bilateral - better after irrigated  - Plan: Ear cerumen removal  Estrogen deficiency - dexa ordered  - Plan: DG Bone Density  History of urinary diversion procedure  doing  Very well    Has to balance diet which is problematic abdominal tenderness times  , rov in 6 months or before moves or as needed  Get dexa  In interim.  Patient Care Team: Burnis Medin, MD as PCP - General Calvert Cantor, MD (Ophthalmology) Elmarie Shiley, MD (Nephrology) Lorelle Gibbs, MD (Radiology)  Patient Instructions   Continue lifestyle intervention healthy eating and exercise . Diabetes is good  Avoid  Low blood glucose . Marland Kitchen   Better to be slightly high than low.  We will send copy of today and labs to dr Posey Pronto.  Get appointment  for dexa bone density .  Yearly flu vaccine.  Let us know how we can help in your transition of care  Moving  to [place] . We can do refills of meds until you get established.  Wishing  you the best .     Standley Brooking. Clent Damore M.D.

## 2015-04-24 NOTE — Patient Instructions (Addendum)
  Continue lifestyle intervention healthy eating and exercise . Diabetes is good  Avoid  Low blood glucose . Marland Kitchen   Better to be slightly high than low.  We will send copy of today and labs to dr Posey Pronto.  Get appointment  for dexa bone density .  Yearly flu vaccine.  Let us know how we can help in your transition of care  Moving  to [place] . We can do refills of meds until you get established.  Wishing you the best .

## 2015-04-27 IMAGING — CR DG CHEST 2V
2 series · 2 of 2 positions shown · non-contrast
Comparison: Chest radiograph 05/13/2012

CLINICAL DATA: Cough and left pleuritis.  Possible pneumonia.

EXAM:
CHEST  2 VIEW

[view not recorded (1 of 2)]
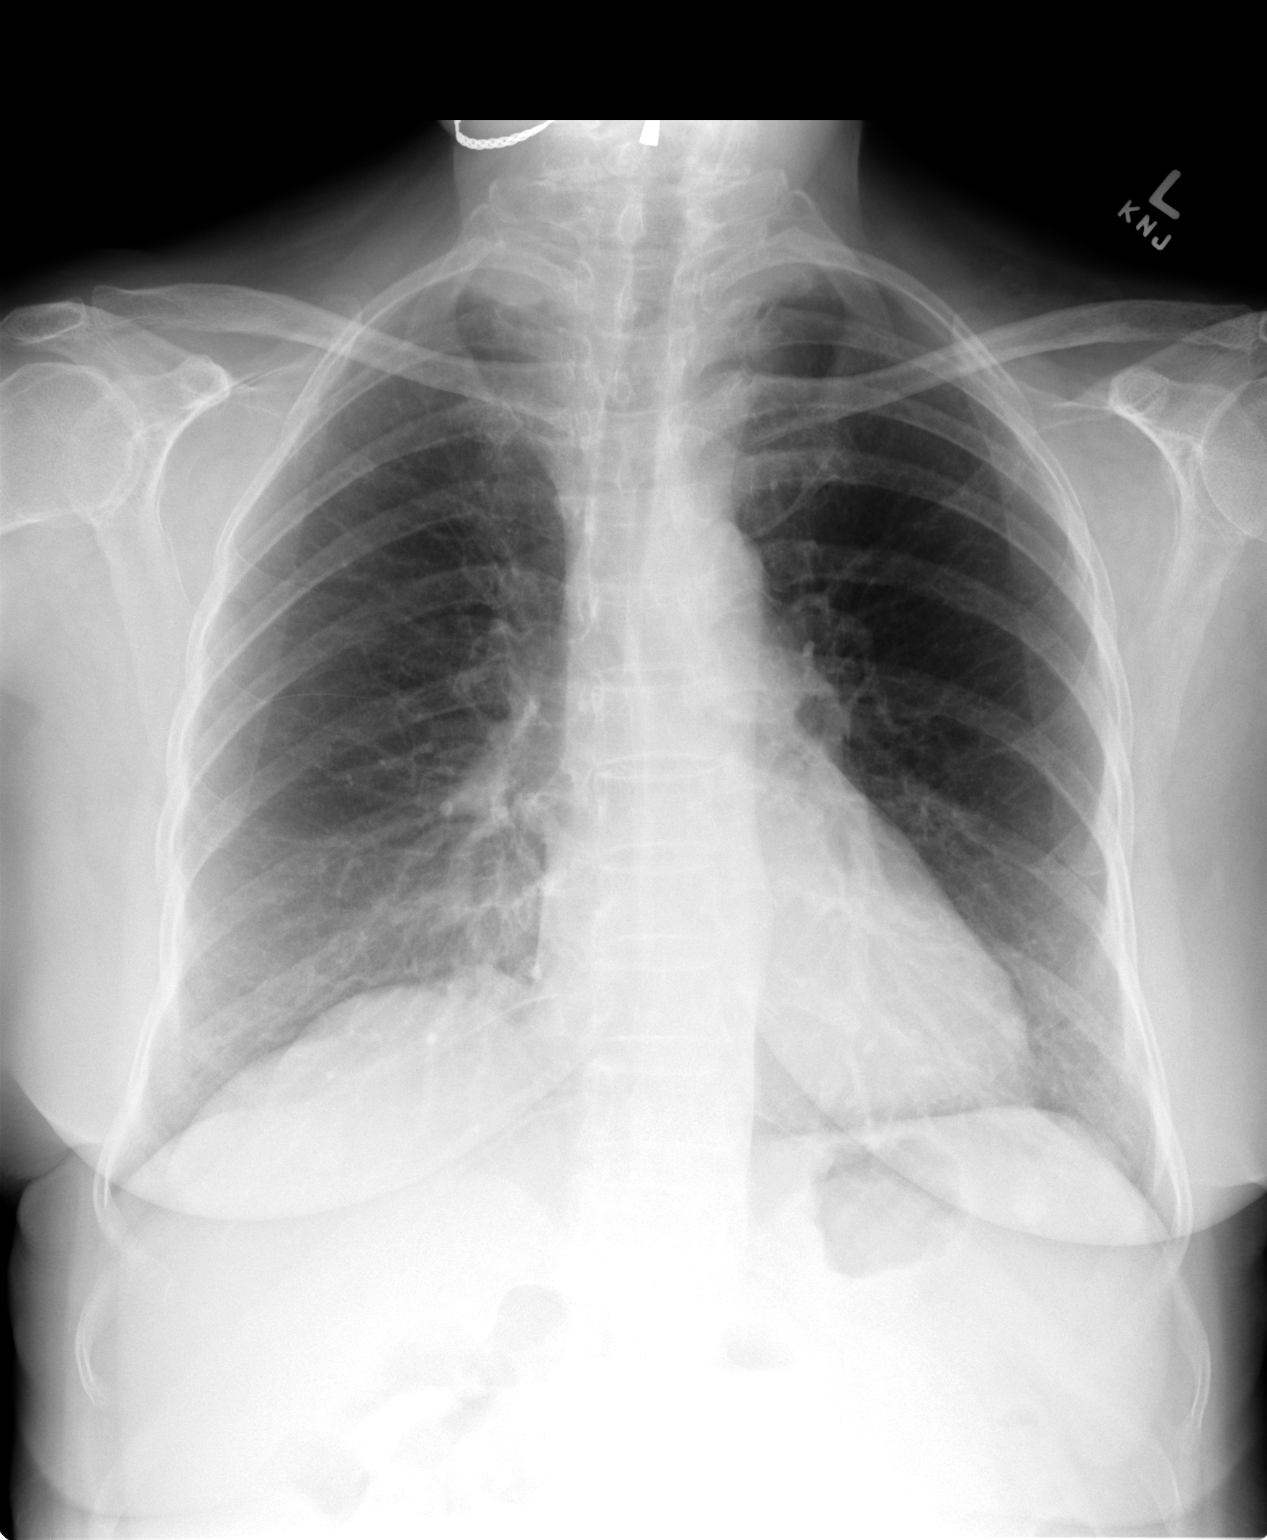

[view not recorded (2 of 2)]
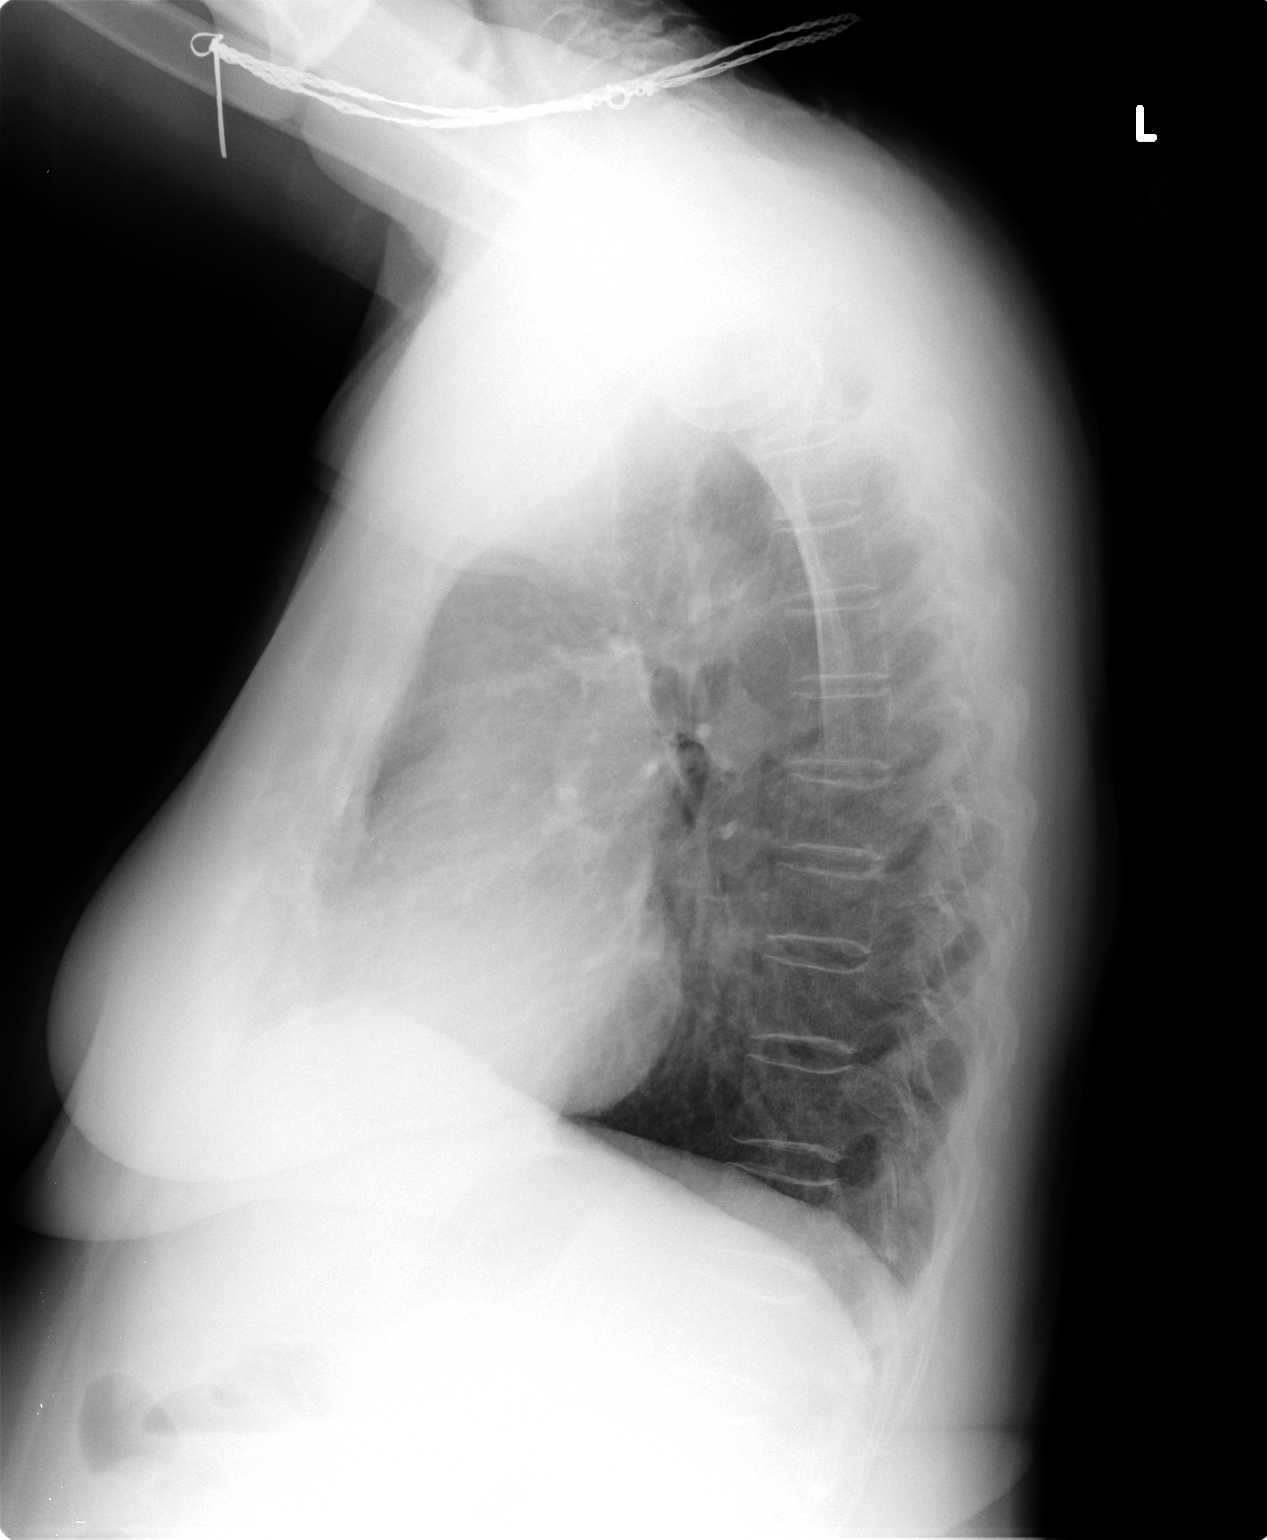

[2 of 2 positions shown; findings below may reference images not displayed]

FINDINGS: The heart, mediastinal, and hilar contours are within normal limits.
The lungs are normally expanded. Slight peribronchial thickening is
unchanged compared to prior studies. Slight biapical pleural
parenchymal thickening is stable. No focal airspace disease,
effusion, or pneumothorax is identified.
IMPRESSION: Stable chest radiograph, with slight chronic bronchitic changes. No
acute cardiopulmonary disease.

## 2015-04-28 ENCOUNTER — Ambulatory Visit (INDEPENDENT_AMBULATORY_CARE_PROVIDER_SITE_OTHER)
Admission: RE | Admit: 2015-04-28 | Discharge: 2015-04-28 | Disposition: A | Discharge status: Home / Self Care | Payer: Medicare Other | Source: Ambulatory Visit | Attending: Internal Medicine | Admitting: Internal Medicine

## 2015-04-28 DIAGNOSIS — N183 Chronic kidney disease, stage 3 unspecified: Secondary | ICD-10-CM

## 2015-04-28 DIAGNOSIS — E2839 Other primary ovarian failure: Secondary | ICD-10-CM

## 2015-04-29 DIAGNOSIS — I129 Hypertensive chronic kidney disease with stage 1 through stage 4 chronic kidney disease, or unspecified chronic kidney disease: Secondary | ICD-10-CM | POA: Insufficient documentation

## 2015-04-29 DIAGNOSIS — Z9889 Other specified postprocedural states: Secondary | ICD-10-CM | POA: Insufficient documentation

## 2015-04-29 DIAGNOSIS — E2839 Other primary ovarian failure: Secondary | ICD-10-CM | POA: Insufficient documentation

## 2015-04-29 DIAGNOSIS — Z0000 Encounter for general adult medical examination without abnormal findings: Secondary | ICD-10-CM | POA: Insufficient documentation

## 2015-07-02 ENCOUNTER — Other Ambulatory Visit: Payer: Self-pay | Admitting: Internal Medicine

## 2015-07-03 NOTE — Telephone Encounter (Signed)
Sent to the pharmacy by e-scribe.  Pt will soon be moving to [place]

## 2015-07-11 ENCOUNTER — Telehealth: Payer: Self-pay | Admitting: Family Medicine

## 2015-07-11 NOTE — Telephone Encounter (Signed)
Pt called and left a message on my machine.  She would like to know how much calcium and vitamin d she needs to be checking.  She has not been taking these supplements.  Is afraid this is why her bone density is do bad.

## 2015-07-11 NOTE — Telephone Encounter (Signed)
Usually 600 mg calcium twice a day  And vit d 1000 iu per day. But often the nephrologist will advised this and check vit d and other levels  Because renal disease can  Be a risk factor for osteoporosis   Also ask Dr Posey Pronto

## 2015-07-12 NOTE — Telephone Encounter (Signed)
Spoke to the pt.  Advised of below instructions and advised she call Dr. Serita Grit office.  She agreed.  Will call back with any other questions.

## 2015-08-10 IMAGING — CR DG KNEE STANDING AP BILAT
1 series · 1 of 1 positions shown · non-contrast
Comparison: None.

CLINICAL DATA: 81-year-old female right knee pain. Initial
encounter.

EXAM:
BILATERAL KNEES STANDING - 1 VIEW

[view not recorded]
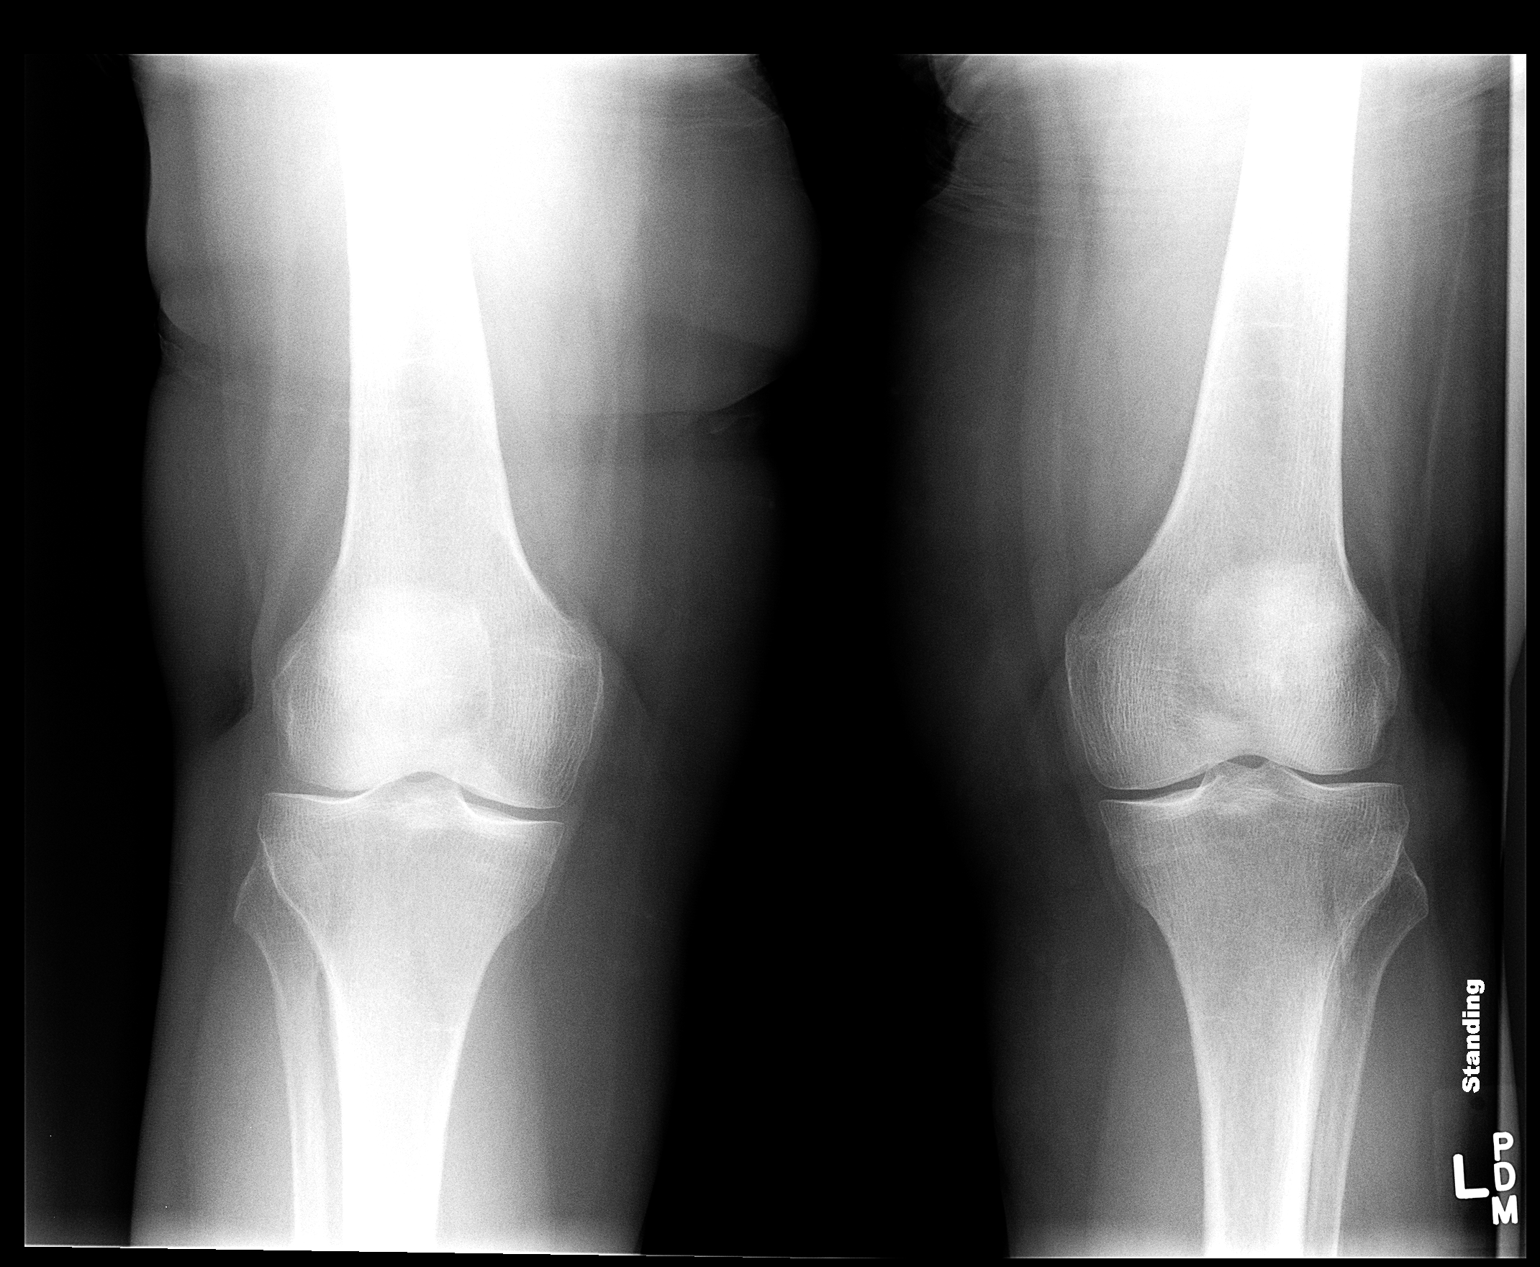

[1 of 1 positions shown; findings below may reference images not displayed]

FINDINGS: Standing view of both knees. Bone mineralization is within normal
limits for age. Medial and lateral compartment joint spaces appear
preserved and symmetric. No osteophytosis. No acute osseous
abnormality identified.
IMPRESSION: Symmetric and preserved medial and lateral joint spaces at the
knees.

## 2015-09-01 ENCOUNTER — Telehealth: Payer: Self-pay | Admitting: Internal Medicine

## 2015-09-01 NOTE — Telephone Encounter (Signed)
Spoke with patient and gave her Dr. Nichola Sizer explanation as per telephone note on 05/17/14.

## 2016-12-13 ENCOUNTER — Telehealth: Payer: Self-pay

## 2016-12-13 NOTE — Telephone Encounter (Signed)
Call to Cassandra Evans. States she is is no longer at this practice and has moved as planned in 2016. Has been out of the area for 16 months now.
